# Patient Record
Sex: Female | Born: 1950 | Race: White | Hispanic: No | Marital: Married | Smoking: Current every day smoker
Health system: Southern US, Community
[De-identification: ages and names within clinical notes are randomized; demographics above are authoritative.]

## PROBLEM LIST (undated history)

## (undated) DIAGNOSIS — L409 Psoriasis, unspecified: Secondary | ICD-10-CM

## (undated) DIAGNOSIS — N3010 Interstitial cystitis (chronic) without hematuria: Secondary | ICD-10-CM

## (undated) DIAGNOSIS — M1990 Unspecified osteoarthritis, unspecified site: Secondary | ICD-10-CM

## (undated) DIAGNOSIS — T7840XA Allergy, unspecified, initial encounter: Secondary | ICD-10-CM

## (undated) HISTORY — DX: Interstitial cystitis (chronic) without hematuria: N30.10

## (undated) HISTORY — DX: Allergy, unspecified, initial encounter: T78.40XA

## (undated) HISTORY — PX: CERVICAL FUSION: SHX112

## (undated) HISTORY — DX: Psoriasis, unspecified: L40.9

## (undated) HISTORY — PX: CERVICAL LAMINECTOMY: SHX94

## (undated) HISTORY — DX: Unspecified osteoarthritis, unspecified site: M19.90

---

## 1998-07-04 ENCOUNTER — Ambulatory Visit (HOSPITAL_COMMUNITY): Admission: RE | Admit: 1998-07-04 | Discharge: 1998-07-04 | Payer: Self-pay | Admitting: Family Medicine

## 1998-07-04 ENCOUNTER — Encounter: Payer: Self-pay | Admitting: Family Medicine

## 2000-02-06 ENCOUNTER — Other Ambulatory Visit: Admission: RE | Admit: 2000-02-06 | Discharge: 2000-02-06 | Payer: Self-pay | Admitting: Obstetrics & Gynecology

## 2000-02-06 ENCOUNTER — Encounter (INDEPENDENT_AMBULATORY_CARE_PROVIDER_SITE_OTHER): Payer: Self-pay

## 2000-04-11 ENCOUNTER — Ambulatory Visit (HOSPITAL_COMMUNITY): Admission: RE | Admit: 2000-04-11 | Discharge: 2000-04-11 | Payer: Self-pay | Admitting: Family Medicine

## 2000-04-11 ENCOUNTER — Encounter: Payer: Self-pay | Admitting: Family Medicine

## 2001-12-11 ENCOUNTER — Other Ambulatory Visit: Admission: RE | Admit: 2001-12-11 | Discharge: 2001-12-11 | Payer: Self-pay | Admitting: Obstetrics & Gynecology

## 2003-03-18 ENCOUNTER — Other Ambulatory Visit: Admission: RE | Admit: 2003-03-18 | Discharge: 2003-03-18 | Payer: Self-pay | Admitting: Obstetrics & Gynecology

## 2004-04-17 ENCOUNTER — Other Ambulatory Visit: Admission: RE | Admit: 2004-04-17 | Discharge: 2004-04-17 | Payer: Self-pay | Admitting: Obstetrics & Gynecology

## 2004-09-03 ENCOUNTER — Ambulatory Visit (HOSPITAL_COMMUNITY): Admission: RE | Admit: 2004-09-03 | Discharge: 2004-09-03 | Payer: Self-pay | Admitting: Family Medicine

## 2004-09-04 ENCOUNTER — Ambulatory Visit (HOSPITAL_COMMUNITY): Admission: RE | Admit: 2004-09-04 | Discharge: 2004-09-04 | Payer: Self-pay | Admitting: Family Medicine

## 2005-06-12 ENCOUNTER — Other Ambulatory Visit: Admission: RE | Admit: 2005-06-12 | Discharge: 2005-06-12 | Payer: Self-pay | Admitting: Obstetrics & Gynecology

## 2005-10-09 ENCOUNTER — Emergency Department (HOSPITAL_COMMUNITY): Admission: EM | Admit: 2005-10-09 | Discharge: 2005-10-09 | Payer: Self-pay | Admitting: Emergency Medicine

## 2010-04-11 ENCOUNTER — Encounter: Admission: RE | Admit: 2010-04-11 | Discharge: 2010-04-11 | Payer: Self-pay

## 2011-05-13 ENCOUNTER — Ambulatory Visit (INDEPENDENT_AMBULATORY_CARE_PROVIDER_SITE_OTHER): Payer: BC Managed Care – PPO

## 2011-05-13 DIAGNOSIS — J019 Acute sinusitis, unspecified: Secondary | ICD-10-CM

## 2012-07-19 ENCOUNTER — Ambulatory Visit (INDEPENDENT_AMBULATORY_CARE_PROVIDER_SITE_OTHER): Payer: BC Managed Care – PPO | Admitting: Family Medicine

## 2012-07-19 VITALS — BP 137/82 | HR 83 | Temp 98.2°F | Resp 18 | Ht 61.5 in | Wt 152.8 lb

## 2012-07-19 DIAGNOSIS — J0100 Acute maxillary sinusitis, unspecified: Secondary | ICD-10-CM

## 2012-07-19 MED ORDER — AMOXICILLIN 500 MG PO CAPS: 1000.0000 mg | ORAL_CAPSULE | Freq: Three times a day (TID) | ORAL | Status: DC

## 2012-07-19 MED ORDER — PREDNISONE 20 MG PO TABS: ORAL_TABLET | ORAL | Status: DC

## 2012-07-19 MED ORDER — PROMETHAZINE-DM 6.25-15 MG/5ML PO SYRP: 5.0000 mL | ORAL_SOLUTION | Freq: Four times a day (QID) | ORAL | Status: DC | PRN

## 2012-07-19 NOTE — Progress Notes (Signed)
Subjective:    Patient ID: Tara Downs, female    DOB: 1951-03-12, 62 y.o.   MRN: [id number] Chief Complaint  Patient presents with  . Headache    congestion x 1 wk ? sinus infection    HPI  HA, sore throat, PND worse at night, little cough.  Has been going on for about a wk - gets right nasal congestion completely closed.   Past Medical History  Diagnosis Date  . Allergy   . Arthritis   . Interstitial cystitis   . Psoriasis    No current outpatient prescriptions on file prior to visit.   No current facility-administered medications on file prior to visit.   Allergies  Allergen Reactions  . Codeine Nausea And Vomiting  . Tetracyclines & Related     Dematologist told pt to never take tetracycline     Review of Systems  Constitutional: Positive for activity change, appetite change and fatigue. Negative for fever, chills and diaphoresis.  HENT: Positive for ear pain, congestion, sore throat, rhinorrhea, sneezing, postnasal drip and sinus pressure. Negative for nosebleeds, trouble swallowing, neck pain, neck stiffness, voice change and ear discharge.   Eyes: Negative for discharge and itching.  Respiratory: Positive for cough. Negative for shortness of breath.   Cardiovascular: Negative for chest pain.  Gastrointestinal: Negative for nausea, vomiting and abdominal pain.  Skin: Negative for rash.  Neurological: Positive for headaches. Negative for dizziness and syncope.  Hematological: Positive for adenopathy.  Psychiatric/Behavioral: Positive for sleep disturbance.      BP 137/82  Pulse 83  Temp(Src) 98.2 F (36.8 C) (Oral)  Resp 18  Ht 5' 1.5" (1.562 m)  Wt 152 lb 12.8 oz (69.31 kg)  BMI 28.41 kg/m2  SpO2 98% Objective:   Physical Exam  Constitutional: She is oriented to person, place, and time. She appears well-developed and well-nourished. She appears lethargic. She appears ill. No distress.  HENT:  Head: Normocephalic and atraumatic.  Right Ear: External  ear and ear canal normal. Tympanic membrane is injected and retracted. A middle ear effusion is present.  Left Ear: External ear and ear canal normal. Tympanic membrane is retracted. A middle ear effusion is present.  Nose: Mucosal edema, rhinorrhea and septal deviation present. Right sinus exhibits maxillary sinus tenderness and frontal sinus tenderness. Left sinus exhibits frontal sinus tenderness. Left sinus exhibits no maxillary sinus tenderness.  Mouth/Throat: Uvula is midline and mucous membranes are normal. Posterior oropharyngeal erythema present. No oropharyngeal exudate, posterior oropharyngeal edema or tonsillar abscesses.  Eyes: Conjunctivae are normal. Right eye exhibits no discharge. Left eye exhibits no discharge. No scleral icterus.  Neck: Normal range of motion. Neck supple.  Cardiovascular: Normal rate, regular rhythm, normal heart sounds and intact distal pulses.   Pulmonary/Chest: Effort normal and breath sounds normal.  Lymphadenopathy:       Head (right side): Submandibular adenopathy present. No preauricular and no posterior auricular adenopathy present.       Head (left side): Submandibular adenopathy present. No preauricular and no posterior auricular adenopathy present.    She has no cervical adenopathy.       Right: No supraclavicular adenopathy present.       Left: No supraclavicular adenopathy present.  Neurological: She is oriented to person, place, and time. She appears lethargic.  Skin: Skin is warm and dry. She is not diaphoretic. No erythema.  Psychiatric: She has a normal mood and affect. Her behavior is normal.     No results found for this or any  previous visit.  Assessment & Plan:  Sinusitis, acute maxillary  Meds ordered this encounter  Medications                                            . amoxicillin (AMOXIL) 500 MG capsule    Sig: Take 2 capsules (1,000 mg total) by mouth 3 (three) times daily.    Dispense:  60 capsule    Refill:  0   . promethazine-dextromethorphan (PROMETHAZINE-DM) 6.25-15 MG/5ML syrup    Sig: Take 5 mLs by mouth 4 (four) times daily as needed for cough.    Dispense:  120 mL    Refill:  0  . predniSONE (DELTASONE) 20 MG tablet    Sig: 3 tabs a day x 2d, 2 tabs a day x 2d, 1 tab a day x 2d    Dispense:  12 tablet    Refill:  0

## 2012-07-19 NOTE — Patient Instructions (Addendum)

## 2013-05-17 ENCOUNTER — Ambulatory Visit (INDEPENDENT_AMBULATORY_CARE_PROVIDER_SITE_OTHER): Payer: BC Managed Care – PPO | Admitting: Family Medicine

## 2013-05-17 VITALS — BP 126/78 | HR 73 | Temp 99.0°F | Resp 17 | Ht 62.0 in | Wt 149.0 lb

## 2013-05-17 DIAGNOSIS — E785 Hyperlipidemia, unspecified: Secondary | ICD-10-CM | POA: Insufficient documentation

## 2013-05-17 DIAGNOSIS — J329 Chronic sinusitis, unspecified: Secondary | ICD-10-CM

## 2013-05-17 DIAGNOSIS — G43909 Migraine, unspecified, not intractable, without status migrainosus: Secondary | ICD-10-CM

## 2013-05-17 MED ORDER — PREDNISONE 20 MG PO TABS: 40.0000 mg | ORAL_TABLET | Freq: Every day | ORAL | Status: DC

## 2013-05-17 MED ORDER — AMOXICILLIN 875 MG PO TABS: 875.0000 mg | ORAL_TABLET | Freq: Two times a day (BID) | ORAL | Status: DC

## 2013-05-17 NOTE — Progress Notes (Signed)
Patient ID: Tara Downs MRN: [id number], DOB: 10/09/1950, 62 y.o. Date of Encounter: 05/17/2013, 4:51 PM  Primary Physician: No primary provider on file.  Chief Complaint:  Chief Complaint  Patient presents with  . Sinusitis  . Nasal Congestion  . Headache    HPI: 62 y.o. year old female presents with 7 day history of nasal congestion, post nasal drip, sore throat, sinus pressure, and cough. Afebrile. No chills. Nasal congestion thick and green/yellow. Sinus pressure is the worst symptom. Cough is productive secondary to post nasal drip and not associated with time of day. Ears feel full, leading to sensation of muffled hearing. Has tried OTC cold preps without success. No GI complaints.   No recent antibiotics, recent travels, or sick contacts   No leg trauma, sedentary periods, h/o cancer, or tobacco use.  Past Medical History  Diagnosis Date  . Allergy   . Arthritis   . Interstitial cystitis   . Psoriasis      Home Meds: Prior to Admission medications   Medication Sig Start Date End Date Taking? Authorizing Provider  acitretin (SORIATANE) 10 MG capsule Take 10 mg by mouth 2 (two) times daily.   Yes Historical Provider, MD  amitriptyline (ELAVIL) 10 MG tablet Take 10 mg by mouth at bedtime.   Yes Historical Provider, MD  aspirin 81 MG tablet Take 81 mg by mouth daily.   Yes Historical Provider, MD  metoprolol (LOPRESSOR) 100 MG tablet Take 100 mg by mouth 1 day or 1 dose.   Yes Historical Provider, MD  pentosan polysulfate (ELMIRON) 100 MG capsule Take 100 mg by mouth 3 (three) times daily before meals.   Yes Historical Provider, MD  rosuvastatin (CRESTOR) 10 MG tablet Take 10 mg by mouth daily.   Yes Historical Provider, MD    Allergies:  Allergies  Allergen Reactions  . Codeine Nausea And Vomiting  . Tetracyclines & Related     Dematologist told pt to never take tetracycline    History   Social History  . Marital Status: Single    Spouse Name: N/A   Number of Children: N/A  . Years of Education: N/A   Occupational History  . Not on file.   Social History Main Topics  . Smoking status: Current Every Day Smoker -- 0.50 packs/day for 20 years    Types: Cigarettes  . Smokeless tobacco: Never Used  . Alcohol Use: No  . Drug Use: No  . Sexual Activity: Yes   Other Topics Concern  . Not on file   Social History Narrative  . No narrative on file     Review of Systems: Constitutional: negative for chills, fever, night sweats or weight changes Cardiovascular: negative for chest pain or palpitations Respiratory: negative for hemoptysis, wheezing, or shortness of breath Abdominal: negative for abdominal pain, nausea, vomiting or diarrhea Dermatological: negative for rash Neurologic: negative for headache   Physical Exam: Blood pressure 126/78, pulse 73, temperature 99 F (37.2 C), temperature source Oral, resp. rate 17, height 5\' 2"  (1.575 m), weight 149 lb (67.586 kg), SpO2 97.00%., Body mass index is 27.25 kg/(m^2). General: Well developed, well nourished, in no acute distress. Head: Normocephalic, atraumatic, eyes without discharge, sclera non-icteric, nares are congested. Bilateral auditory canals clear, TM's are without perforation, pearly grey with reflective cone of light bilaterally. Serous effusion bilaterally behind TM's. Maxillary sinus TTP. Oral cavity moist, dentition normal. Posterior pharynx with post nasal drip and mild erythema. No peritonsillar abscess or tonsillar exudate. Neck:  Supple. No thyromegaly. Full ROM. No lymphadenopathy. Lungs: Clear bilaterally to auscultation without wheezes, rales, or rhonchi. Breathing is unlabored.  Heart: RRR with S1 S2. No murmurs, rubs, or gallops appreciated. Msk:  Strength and tone normal for age. Extremities: No clubbing or cyanosis. No edema. Neuro: Alert and oriented X 3. Moves all extremities spontaneously. CNII-XII grossly in tact. Psych:  Responds to questions  appropriately with a normal affect.    ASSESSMENT AND PLAN:  62 y.o. year old female with sinusitis -Sinusitis - Plan: amoxicillin (AMOXIL) 875 MG tablet, predniSONE (DELTASONE) 20 MG tablet  Hyperlipidemia  Migraine headache    -Tylenol/Motrin prn -Rest/fluids -RTC precautions -RTC 3-5 days if no improvement  Signed, Elvina Sidle, MD 05/17/2013 4:51 PM

## 2013-05-17 NOTE — Patient Instructions (Signed)

## 2013-08-04 ENCOUNTER — Emergency Department (HOSPITAL_COMMUNITY)
Admission: EM | Admit: 2013-08-04 | Discharge: 2013-08-04 | Disposition: A | Discharge status: Home / Self Care | Payer: BC Managed Care – PPO | Attending: Emergency Medicine | Admitting: Emergency Medicine

## 2013-08-04 ENCOUNTER — Encounter (HOSPITAL_COMMUNITY): Payer: Self-pay | Admitting: Emergency Medicine

## 2013-08-04 ENCOUNTER — Emergency Department (HOSPITAL_COMMUNITY): Payer: BC Managed Care – PPO

## 2013-08-04 DIAGNOSIS — Z791 Long term (current) use of non-steroidal anti-inflammatories (NSAID): Secondary | ICD-10-CM | POA: Insufficient documentation

## 2013-08-04 DIAGNOSIS — Z7982 Long term (current) use of aspirin: Secondary | ICD-10-CM | POA: Insufficient documentation

## 2013-08-04 DIAGNOSIS — Z79899 Other long term (current) drug therapy: Secondary | ICD-10-CM | POA: Insufficient documentation

## 2013-08-04 DIAGNOSIS — R3915 Urgency of urination: Secondary | ICD-10-CM | POA: Insufficient documentation

## 2013-08-04 DIAGNOSIS — Z87448 Personal history of other diseases of urinary system: Secondary | ICD-10-CM | POA: Insufficient documentation

## 2013-08-04 DIAGNOSIS — R197 Diarrhea, unspecified: Secondary | ICD-10-CM | POA: Insufficient documentation

## 2013-08-04 DIAGNOSIS — Z981 Arthrodesis status: Secondary | ICD-10-CM | POA: Insufficient documentation

## 2013-08-04 DIAGNOSIS — L408 Other psoriasis: Secondary | ICD-10-CM | POA: Insufficient documentation

## 2013-08-04 DIAGNOSIS — F17200 Nicotine dependence, unspecified, uncomplicated: Secondary | ICD-10-CM | POA: Insufficient documentation

## 2013-08-04 DIAGNOSIS — M129 Arthropathy, unspecified: Secondary | ICD-10-CM | POA: Insufficient documentation

## 2013-08-04 DIAGNOSIS — R112 Nausea with vomiting, unspecified: Secondary | ICD-10-CM | POA: Insufficient documentation

## 2013-08-04 DIAGNOSIS — M542 Cervicalgia: Secondary | ICD-10-CM | POA: Insufficient documentation

## 2013-08-04 DIAGNOSIS — N23 Unspecified renal colic: Secondary | ICD-10-CM | POA: Insufficient documentation

## 2013-08-04 LAB — URINALYSIS, ROUTINE W REFLEX MICROSCOPIC
BILIRUBIN URINE: NEGATIVE
GLUCOSE, UA: NEGATIVE mg/dL
Ketones, ur: 15 mg/dL — AB
Nitrite: NEGATIVE
PH: 6 (ref 5.0–8.0)
Protein, ur: 30 mg/dL — AB
SPECIFIC GRAVITY, URINE: 1.018 (ref 1.005–1.030)
Urobilinogen, UA: 0.2 mg/dL (ref 0.0–1.0)

## 2013-08-04 LAB — CBC WITH DIFFERENTIAL/PLATELET
Basophils Absolute: 0 10*3/uL (ref 0.0–0.1)
Basophils Relative: 0 % (ref 0–1)
Eosinophils Absolute: 0 10*3/uL (ref 0.0–0.7)
Eosinophils Relative: 0 % (ref 0–5)
HCT: 41.1 % (ref 36.0–46.0)
HEMOGLOBIN: 13.9 g/dL (ref 12.0–15.0)
LYMPHS ABS: 1.1 10*3/uL (ref 0.7–4.0)
Lymphocytes Relative: 8 % — ABNORMAL LOW (ref 12–46)
MCH: 30.4 pg (ref 26.0–34.0)
MCHC: 33.8 g/dL (ref 30.0–36.0)
MCV: 89.9 fL (ref 78.0–100.0)
Monocytes Absolute: 0.8 10*3/uL (ref 0.1–1.0)
Monocytes Relative: 6 % (ref 3–12)
NEUTROS ABS: 12.6 10*3/uL — AB (ref 1.7–7.7)
NEUTROS PCT: 87 % — AB (ref 43–77)
PLATELETS: 248 10*3/uL (ref 150–400)
RBC: 4.57 MIL/uL (ref 3.87–5.11)
RDW: 12.2 % (ref 11.5–15.5)
WBC: 14.6 10*3/uL — ABNORMAL HIGH (ref 4.0–10.5)

## 2013-08-04 LAB — URINE MICROSCOPIC-ADD ON

## 2013-08-04 LAB — COMPREHENSIVE METABOLIC PANEL
ALBUMIN: 4.5 g/dL (ref 3.5–5.2)
ALK PHOS: 78 U/L (ref 39–117)
ALT: 19 U/L (ref 0–35)
AST: 23 U/L (ref 0–37)
BILIRUBIN TOTAL: 0.3 mg/dL (ref 0.3–1.2)
BUN: 13 mg/dL (ref 6–23)
CHLORIDE: 99 meq/L (ref 96–112)
CO2: 25 mEq/L (ref 19–32)
Calcium: 9.3 mg/dL (ref 8.4–10.5)
Creatinine, Ser: 0.99 mg/dL (ref 0.50–1.10)
GFR calc Af Amer: 70 mL/min — ABNORMAL LOW (ref >90)
GFR calc non Af Amer: 61 mL/min — ABNORMAL LOW (ref >90)
Glucose, Bld: 130 mg/dL — ABNORMAL HIGH (ref 70–99)
POTASSIUM: 3.4 meq/L — AB (ref 3.7–5.3)
SODIUM: 139 meq/L (ref 137–147)
Total Protein: 6.9 g/dL (ref 6.0–8.3)

## 2013-08-04 LAB — LIPASE, BLOOD: Lipase: 17 U/L (ref 11–59)

## 2013-08-04 MED ORDER — IOHEXOL 300 MG/ML  SOLN
50.0000 mL | Freq: Once | INTRAMUSCULAR | Status: AC | PRN
  Administered 2013-08-04: 50 mL via ORAL

## 2013-08-04 MED ORDER — HYDROMORPHONE HCL 2 MG PO TABS: 2.0000 mg | ORAL_TABLET | ORAL | Status: DC | PRN

## 2013-08-04 MED ORDER — ONDANSETRON 4 MG PO TBDP: 4.0000 mg | ORAL_TABLET | Freq: Three times a day (TID) | ORAL | Status: DC | PRN

## 2013-08-04 MED ORDER — SODIUM CHLORIDE 0.9 % IV BOLUS (SEPSIS)
1000.0000 mL | Freq: Once | INTRAVENOUS | Status: AC
  Administered 2013-08-04: 1000 mL via INTRAVENOUS

## 2013-08-04 MED ORDER — KETOROLAC TROMETHAMINE 15 MG/ML IJ SOLN
30.0000 mg | Freq: Once | INTRAMUSCULAR | Status: AC
  Administered 2013-08-04: 30 mg via INTRAVENOUS
  Filled 2013-08-04: qty 2

## 2013-08-04 MED ORDER — HYDROMORPHONE HCL PF 1 MG/ML IJ SOLN
1.0000 mg | INTRAMUSCULAR | Status: DC | PRN
  Administered 2013-08-04: 1 mg via INTRAVENOUS

## 2013-08-04 MED ORDER — TAMSULOSIN HCL 0.4 MG PO CAPS
0.4000 mg | ORAL_CAPSULE | Freq: Once | ORAL | Status: AC
  Administered 2013-08-04: 0.4 mg via ORAL
  Filled 2013-08-04: qty 1

## 2013-08-04 MED ORDER — HYDROMORPHONE HCL PF 1 MG/ML IJ SOLN
1.0000 mg | INTRAMUSCULAR | Status: DC | PRN
  Administered 2013-08-04: 1 mg via INTRAVENOUS
  Filled 2013-08-04 (×2): qty 1

## 2013-08-04 MED ORDER — IOHEXOL 300 MG/ML  SOLN
100.0000 mL | Freq: Once | INTRAMUSCULAR | Status: AC | PRN
  Administered 2013-08-04: 100 mL via INTRAVENOUS

## 2013-08-04 MED ORDER — ONDANSETRON HCL 4 MG/2ML IJ SOLN
4.0000 mg | Freq: Once | INTRAMUSCULAR | Status: AC
  Administered 2013-08-04: 4 mg via INTRAVENOUS
  Filled 2013-08-04: qty 2

## 2013-08-04 MED ORDER — IBUPROFEN 800 MG PO TABS: 800.0000 mg | ORAL_TABLET | Freq: Three times a day (TID) | ORAL | Status: DC

## 2013-08-04 MED ORDER — TAMSULOSIN HCL 0.4 MG PO CAPS: 0.4000 mg | ORAL_CAPSULE | Freq: Every day | ORAL | Status: DC

## 2013-08-04 NOTE — ED Provider Notes (Signed)
CSN: [id number]     Arrival date & time 08/04/13  0730 History   First MD Initiated Contact with Patient 08/04/13 [phone]     Chief Complaint  Patient presents with  . Abdominal Pain     HPI  Patient presents with abdominal pain. Nonlocalizing diffuse abdominal pain. He is on some nausea some diarrhea. She takes Pepto-Bismol. Felt better on Monday. Yesterday was Tuesday. Also nausea. Had pain in her abdomen is diffuse and nonlocalizing. Not intermittent but constant. She began vomiting again this morning he presents here. Pain is not localized she either the back of her anterior abdomen. No left or right side localization. No pain with movement. No fevers or chills. History of interstitial cystitis. Follows with [facility]. States this feels nothing like her symptoms with that. For dysuria frequency or hematuria.  Past Medical History  Diagnosis Date  . Allergy   . Arthritis   . Interstitial cystitis   . Psoriasis    Past Surgical History  Procedure Laterality Date  . Cervical laminectomy    . Cervical fusion     History reviewed. No pertinent family history. History  Substance Use Topics  . Smoking status: Current Every Day Smoker -- 0.50 packs/day for 20 years    Types: Cigarettes  . Smokeless tobacco: Never Used  . Alcohol Use: No   OB History   Grav Para Term Preterm Abortions TAB SAB Ect Mult Living                 Review of Systems  Constitutional: Negative for fever, chills, diaphoresis, appetite change and fatigue.  HENT: Negative for mouth sores, sore throat and trouble swallowing.   Eyes: Negative for visual disturbance.  Respiratory: Negative for cough, chest tightness, shortness of breath and wheezing.   Cardiovascular: Negative for chest pain.  Gastrointestinal: Positive for nausea, vomiting, abdominal pain and diarrhea. Negative for abdominal distention.  Endocrine: Negative for polydipsia, polyphagia and polyuria.  Genitourinary: Positive for urgency.  Negative for dysuria, frequency and hematuria.  Musculoskeletal: Positive for neck pain. Negative for gait problem and neck stiffness.  Skin: Negative for color change, pallor and rash.  Neurological: Negative for dizziness, syncope, light-headedness and headaches.  Hematological: Does not bruise/bleed easily.  Psychiatric/Behavioral: Negative for behavioral problems and confusion.      Allergies  Codeine and Tetracyclines & related  Home Medications   Current Outpatient Rx  Name  Route  Sig  Dispense  Refill  . acitretin (SORIATANE) 10 MG capsule   Oral   Take 20 mg by mouth daily.          Marland Kitchen amitriptyline (ELAVIL) 10 MG tablet   Oral   Take 10 mg by mouth at bedtime.         Marland Kitchen aspirin 81 MG tablet   Oral   Take 81 mg by mouth daily.         Marland Kitchen atorvastatin (LIPITOR) 20 MG tablet   Oral   Take 20 mg by mouth daily.         Marland Kitchen bismuth subsalicylate (PEPTO BISMOL) 262 MG/15ML suspension   Oral   Take 15 mLs by mouth every 2 (two) hours as needed for indigestion or diarrhea or loose stools.         . metoprolol (LOPRESSOR) 100 MG tablet   Oral   Take 100 mg by mouth daily.          . pentosan polysulfate (ELMIRON) 100 MG capsule   Oral  Take 100 mg by mouth 2 (two) times daily before a meal.          . traMADol (ULTRAM) 50 MG tablet   Oral   Take 50 mg by mouth every 6 (six) hours as needed for moderate pain.         Marland Kitchen tretinoin (RETIN-A) 0.05 % cream   Topical   Apply 1 application topically daily as needed.         Marland Kitchen HYDROmorphone (DILAUDID) 2 MG tablet   Oral   Take 1 tablet (2 mg total) by mouth every 4 (four) hours as needed for moderate pain.   20 tablet   0   . ibuprofen (ADVIL,MOTRIN) 800 MG tablet   Oral   Take 1 tablet (800 mg total) by mouth 3 (three) times daily.   21 tablet   0   . ondansetron (ZOFRAN ODT) 4 MG disintegrating tablet   Oral   Take 1 tablet (4 mg total) by mouth every 8 (eight) hours as needed for nausea.    20 tablet   0   . tamsulosin (FLOMAX) 0.4 MG CAPS capsule   Oral   Take 1 capsule (0.4 mg total) by mouth daily.   7 capsule   0    BP 132/57  Pulse 88  Temp(Src) 98.3 F (36.8 C) (Oral)  Resp 18  Wt 145 lb (65.772 kg)  SpO2 98% Physical Exam  Constitutional: She is oriented to person, place, and time. She appears well-developed and well-nourished. No distress.  Patient couple. Not writhing  HENT:  Head: Normocephalic.    Eyes: Conjunctivae are normal. Pupils are equal, round, and reactive to light. No scleral icterus.  Neck: Normal range of motion. Neck supple. No thyromegaly present.    Supple.  No meningismus  Cardiovascular: Normal rate and regular rhythm.  Exam reveals no gallop and no friction rub.   No murmur heard. Pulmonary/Chest: Effort normal and breath sounds normal. No respiratory distress. She has no wheezes. She has no rales.  Abdominal: Soft. Bowel sounds are normal. She exhibits no distension. There is no tenderness. There is no rebound.  Complaints of diffuse pain. No peritoneal irritation. No reproducible pain in the left , a right lower abdomen.  Musculoskeletal: Normal range of motion.  Neurological: She is alert and oriented to person, place, and time.     Skin: Skin is warm and dry. No rash noted.  Psychiatric: She has a normal mood and affect. Her behavior is normal.    ED Course  Procedures (including critical care time) Labs Review Labs Reviewed  CBC WITH DIFFERENTIAL - Abnormal; Notable for the following:    WBC 14.6 (*)    Neutrophils Relative % 87 (*)    Neutro Abs 12.6 (*)    Lymphocytes Relative 8 (*)    All other components within normal limits  COMPREHENSIVE METABOLIC PANEL - Abnormal; Notable for the following:    Potassium 3.4 (*)    Glucose, Bld 130 (*)    GFR calc non Af Amer 61 (*)    GFR calc Af Amer 70 (*)    All other components within normal limits  URINALYSIS, ROUTINE W REFLEX MICROSCOPIC - Abnormal; Notable for the  following:    APPearance CLOUDY (*)    Hgb urine dipstick LARGE (*)    Ketones, ur 15 (*)    Protein, ur 30 (*)    Leukocytes, UA SMALL (*)    All other components within normal limits  URINE MICROSCOPIC-ADD ON - Abnormal; Notable for the following:    Bacteria, UA FEW (*)    Crystals CA OXALATE CRYSTALS (*)    All other components within normal limits  LIPASE, BLOOD   Imaging Review Ct Abdomen Pelvis W Contrast  08/04/2013   CLINICAL DATA Abdominal pain  EXAM CT ABDOMEN AND PELVIS WITH CONTRAST  TECHNIQUE Multidetector CT imaging of the abdomen and pelvis was performed using the standard protocol following bolus administration of intravenous contrast.  CONTRAST 57mL OMNIPAQUE IOHEXOL 300 MG/ML SOLN, 150mL OMNIPAQUE IOHEXOL 300 MG/ML SOLN  COMPARISON CT CHEST W/CM dated 09/04/2004  FINDINGS There is a 2 mm left lower lobe subpleural pulmonary nodule unchanged compared with 09/05/2006. There is a calcified 3 mm left lower lobe subpleural pulmonary nodule likely representing sequela prior granulomatous disease unchanged from the prior exam.  The liver demonstrates no focal abnormality. There is no intrahepatic or extrahepatic biliary ductal dilatation. The gallbladder is normal. The spleen demonstrates no focal abnormality. There is a 6 mm distal right ureteral calculus just proximal to the UVJ resulting in moderate right hydroureteronephrosis and right perinephric stranding. Are punctate nonobstructing bilateral renal calculi. The adrenal glands and pancreas are normal. The bladder is unremarkable.  The stomach, duodenum, small intestine, and large intestine demonstrate no contrast extravasation or dilatation. There is no pneumoperitoneum, pneumatosis, or portal venous gas. There is no abdominal or pelvic free fluid. There is no lymphadenopathy. There are bilateral pleural lymph nodes measuring 7 mm in short axis on the right and 6.8 mm in short axis on the left unchanged in the prior exam.  The  abdominal aorta is normal in caliber with atherosclerosis.  There are no lytic or sclerotic osseous lesions.  IMPRESSION There is a 6 mm distal right ureteral calculus just proximal to the UVJ resulting in moderate right hydroureteronephrosis and right perinephric stranding.  SIGNATURE  Electronically Signed   By: Kathreen Devoid   On: 08/04/2013 10:27     EKG Interpretation None      MDM   Final diagnoses:  Ureteral colic    PT DC'd after medication.  Urology f/u prn.    Tanna Furry, MD 08/05/13 1100

## 2013-08-04 NOTE — Discharge Instructions (Signed)
Kidney Stones  Kidney stones (urolithiasis) are deposits that form inside your kidneys. The intense pain is caused by the stone moving through the urinary tract. When the stone moves, the ureter goes into spasm around the stone. The stone is usually passed in the urine.   CAUSES   · A disorder that makes certain neck glands produce too much parathyroid hormone (primary hyperparathyroidism).  · A buildup of uric acid crystals, similar to gout in your joints.  · Narrowing (stricture) of the ureter.  · A kidney obstruction present at birth (congenital obstruction).  · Previous surgery on the kidney or ureters.  · Numerous kidney infections.  SYMPTOMS   · Feeling sick to your stomach (nauseous).  · Throwing up (vomiting).  · Blood in the urine (hematuria).  · Pain that usually spreads (radiates) to the groin.  · Frequency or urgency of urination.  DIAGNOSIS   · Taking a history and physical exam.  · Blood or urine tests.  · CT scan.  · Occasionally, an examination of the inside of the urinary bladder (cystoscopy) is performed.  TREATMENT   · Observation.  · Increasing your fluid intake.  · Extracorporeal shock wave lithotripsy This is a noninvasive procedure that uses shock waves to break up kidney stones.  · Surgery may be needed if you have severe pain or persistent obstruction. There are various surgical procedures. Most of the procedures are performed with the use of small instruments. Only small incisions are needed to accommodate these instruments, so recovery time is minimized.  The size, location, and chemical composition are all important variables that will determine the proper choice of action for you. Talk to your health care provider to better understand your situation so that you will minimize the risk of injury to yourself and your kidney.   HOME CARE INSTRUCTIONS   · Drink enough water and fluids to keep your urine clear or pale yellow. This will help you to pass the stone or stone fragments.  · Strain  all urine through the provided strainer. Keep all particulate matter and stones for your health care provider to see. The stone causing the pain may be as small as a grain of salt. It is very important to use the strainer each and every time you pass your urine. The collection of your stone will allow your health care provider to analyze it and verify that a stone has actually passed. The stone analysis will often identify what you can do to reduce the incidence of recurrences.  · Only take over-the-counter or prescription medicines for pain, discomfort, or fever as directed by your health care provider.  · Make a follow-up appointment with your health care provider as directed.  · Get follow-up X-rays if required. The absence of pain does not always mean that the stone has passed. It may have only stopped moving. If the urine remains completely obstructed, it can cause loss of kidney function or even complete destruction of the kidney. It is your responsibility to make sure X-rays and follow-ups are completed. Ultrasounds of the kidney can show blockages and the status of the kidney. Ultrasounds are not associated with any radiation and can be performed easily in a matter of minutes.  SEEK MEDICAL CARE IF:  · You experience pain that is progressive and unresponsive to any pain medicine you have been prescribed.  SEEK IMMEDIATE MEDICAL CARE IF:   · Pain cannot be controlled with the prescribed medicine.  · You have a fever   or shaking chills.  · The severity or intensity of pain increases over 18 hours and is not relieved by pain medicine.  · You develop a new onset of abdominal pain.  · You feel faint or pass out.  · You are unable to urinate.  MAKE SURE YOU:   · Understand these instructions.  · Will watch your condition.  · Will get help right away if you are not doing well or get worse.  Document Released: 05/13/2005 Document Revised: 01/13/2013 Document Reviewed: 10/14/2012  [employer]® Patient Information ©2014  [employer].

## 2013-08-04 NOTE — ED Notes (Signed)
Pt alert, arrives from home, c/o gen abd pain, onset was Monday, states diarrhea and pain, went to work yesterday, pain returns last PM, + nausea, + urgency,  resp even unlabored, skin pwd

## 2013-08-13 ENCOUNTER — Ambulatory Visit (INDEPENDENT_AMBULATORY_CARE_PROVIDER_SITE_OTHER): Payer: BC Managed Care – PPO | Admitting: Emergency Medicine

## 2013-08-13 VITALS — BP 136/60 | HR 90 | Temp 98.4°F | Resp 18 | Ht 61.5 in | Wt 141.8 lb

## 2013-08-13 DIAGNOSIS — J329 Chronic sinusitis, unspecified: Secondary | ICD-10-CM

## 2013-08-13 MED ORDER — FLUTICASONE PROPIONATE 50 MCG/ACT NA SUSP: 2.0000 | Freq: Every day | NASAL | Status: DC

## 2013-08-13 MED ORDER — AMOXICILLIN 875 MG PO TABS: 875.0000 mg | ORAL_TABLET | Freq: Two times a day (BID) | ORAL | Status: DC

## 2013-08-13 NOTE — Patient Instructions (Signed)

## 2013-08-13 NOTE — Progress Notes (Signed)
   Subjective:    Patient ID: Tara Downs, female    DOB: Jan 24, 1951, 63 y.o.   MRN: [id number]  HPI  63 y.o. Female presents to clinic with sinus symptoms, congestions and headache. Reports that she is currently under the care of [facility] for a kidney stone. States that it's a 6.  Reports that drainage is yellow in color. States that she has a productive cough that just started. Reports that symptoms started with sore throat and moved down from there.    Review of Systems     Objective:   Physical Exam HEENT exam reveals TMs to be clear nose is very dry with crusting throat is normal chest is clear        Assessment & Plan:  Patient currently in the process of passing a kidney stone. She will be treated with amoxicillin and Flonase. For a sinus infection.

## 2014-01-26 ENCOUNTER — Other Ambulatory Visit: Payer: Self-pay | Admitting: Obstetrics & Gynecology

## 2014-01-26 DIAGNOSIS — R928 Other abnormal and inconclusive findings on diagnostic imaging of breast: Secondary | ICD-10-CM

## 2014-02-04 ENCOUNTER — Ambulatory Visit
Admission: RE | Admit: 2014-02-04 | Discharge: 2014-02-04 | Disposition: A | Discharge status: Home / Self Care | Payer: BC Managed Care – PPO | Source: Ambulatory Visit | Attending: Obstetrics & Gynecology | Admitting: Obstetrics & Gynecology

## 2014-02-04 DIAGNOSIS — R928 Other abnormal and inconclusive findings on diagnostic imaging of breast: Secondary | ICD-10-CM

## 2014-02-07 ENCOUNTER — Other Ambulatory Visit: Payer: BC Managed Care – PPO

## 2014-04-09 ENCOUNTER — Ambulatory Visit (INDEPENDENT_AMBULATORY_CARE_PROVIDER_SITE_OTHER): Payer: BC Managed Care – PPO | Admitting: Family Medicine

## 2014-04-09 VITALS — BP 131/77 | HR 62 | Temp 98.1°F | Resp 16 | Ht 62.0 in | Wt 150.0 lb

## 2014-04-09 DIAGNOSIS — R300 Dysuria: Secondary | ICD-10-CM

## 2014-04-09 LAB — POCT URINALYSIS DIPSTICK
Bilirubin, UA: NEGATIVE
Glucose, UA: NEGATIVE
Ketones, UA: NEGATIVE
Nitrite, UA: NEGATIVE
Protein, UA: NEGATIVE
Spec Grav, UA: 1.01
Urobilinogen, UA: 0.2
pH, UA: 6

## 2014-04-09 LAB — POCT UA - MICROSCOPIC ONLY
Casts, Ur, LPF, POC: NEGATIVE
Crystals, Ur, HPF, POC: NEGATIVE
Mucus, UA: NEGATIVE
RBC, urine, microscopic: NEGATIVE
Yeast, UA: NEGATIVE

## 2014-04-09 MED ORDER — CIPROFLOXACIN HCL 250 MG PO TABS: 250.0000 mg | ORAL_TABLET | Freq: Two times a day (BID) | ORAL | Status: DC

## 2014-04-09 MED ORDER — PHENAZOPYRIDINE HCL 200 MG PO TABS: 200.0000 mg | ORAL_TABLET | Freq: Three times a day (TID) | ORAL | Status: DC | PRN

## 2014-04-09 NOTE — Progress Notes (Signed)
Subjective:    Patient ID: Tara Downs, female    DOB: 04/18/1951, 63 y.o.   MRN: [id number]  HPI Chief Complaint  Patient presents with  . Dysuria    x last night  . Back Pain    x last night   This chart was scribed for Robyn Haber, MD by Thea Alken, ED Scribe. This patient was seen in room 4 and the patient's care was started at 2:20 PM.  HPI Comments: Tara Downs is a 63 y.o. female who presents to the [facility] complaining of a possible UTI with associated dysuria and back pain that began last night. Pt has tried uribel without relief due to medication possibly being expired. She reports hx of interstitial cyst but denies similar symptoms. She denies fever, chills, and hematuria.   Past Medical History  Diagnosis Date  . Allergy   . Arthritis   . Interstitial cystitis   . Psoriasis    Allergies  Allergen Reactions  . Codeine Nausea And Vomiting  . Tetracyclines & Related Other (See Comments)    Dematologist told pt to never take tetracycline, drug interactions    Prior to Admission medications   Medication Sig Start Date End Date Taking? Authorizing Provider  acitretin (SORIATANE) 10 MG capsule Take 20 mg by mouth daily.    Yes Historical Provider, MD  amitriptyline (ELAVIL) 10 MG tablet Take 10 mg by mouth at bedtime.   Yes Historical Provider, MD  aspirin 81 MG tablet Take 81 mg by mouth daily.   Yes Historical Provider, MD  atorvastatin (LIPITOR) 20 MG tablet Take 20 mg by mouth daily.   Yes Historical Provider, MD  ibuprofen (ADVIL,MOTRIN) 800 MG tablet Take 1 tablet (800 mg total) by mouth 3 (three) times daily. 08/04/13  Yes Tanna Furry, MD  metoprolol (LOPRESSOR) 100 MG tablet Take 100 mg by mouth daily.    Yes Historical Provider, MD  pentosan polysulfate (ELMIRON) 100 MG capsule Take 100 mg by mouth 2 (two) times daily before a meal.    Yes Historical Provider, MD  traMADol (ULTRAM) 50 MG tablet Take 50 mg by mouth every 6 (six)  hours as needed for moderate pain.   Yes Historical Provider, MD  tretinoin (RETIN-A) 0.05 % cream Apply 1 application topically daily as needed.   Yes Historical Provider, MD  amoxicillin (AMOXIL) 875 MG tablet Take 1 tablet (875 mg total) by mouth 2 (two) times daily. 08/13/13   Darlyne Russian, MD  fluticasone (FLONASE) 50 MCG/ACT nasal spray Place 2 sprays into both nostrils daily. 08/13/13   Darlyne Russian, MD  HYDROmorphone (DILAUDID) 2 MG tablet Take 1 tablet (2 mg total) by mouth every 4 (four) hours as needed for moderate pain. 08/04/13   Tanna Furry, MD  oxybutynin (DITROPAN) 5 MG tablet Take 5 mg by mouth 3 (three) times daily.    Historical Provider, MD  phenazopyridine (PYRIDIUM) 200 MG tablet Take 200 mg by mouth 3 (three) times daily as needed for pain.    Historical Provider, MD  tamsulosin (FLOMAX) 0.4 MG CAPS capsule Take 1 capsule (0.4 mg total) by mouth daily. 08/04/13   Tanna Furry, MD   Review of Systems  Constitutional: Negative for fever and chills.  Genitourinary: Positive for dysuria. Negative for hematuria.  Musculoskeletal: Positive for back pain.    Objective:   Physical Exam  Constitutional: She is oriented to person, place, and time. She appears well-developed and well-nourished. No distress.  HENT:  Head: Normocephalic and atraumatic.  Eyes: Conjunctivae and EOM are normal.  Neck: Neck supple.  Cardiovascular: Normal rate.   Pulmonary/Chest: Effort normal.  Musculoskeletal: Normal range of motion.  Neurological: She is alert and oriented to person, place, and time.  Skin: Skin is warm and dry.  Psychiatric: She has a normal mood and affect. Her behavior is normal.  Nursing note and vitals reviewed.  Filed Vitals:   04/09/14 1412  BP: 131/77  Pulse: 62  Temp: 98.1 F (36.7 C)  Resp: 16    Assessment & Plan:   1. Dysuria    Meds ordered this encounter  Medications  . phenazopyridine (PYRIDIUM) 200 MG tablet    Sig: Take 1 tablet (200 mg total) by  mouth 3 (three) times daily as needed for pain.    Dispense:  10 tablet    Refill:  3   Dysuria - Plan: POCT urinalysis dipstick, POCT UA - Microscopic Only, phenazopyridine (PYRIDIUM) 200 MG tablet, Urine culture, ciprofloxacin (CIPRO) 250 MG tablet  Signed, Robyn Haber, MD

## 2014-04-09 NOTE — Patient Instructions (Signed)

## 2014-04-11 LAB — URINE CULTURE: Colony Count: >=100000

## 2014-07-01 ENCOUNTER — Other Ambulatory Visit: Payer: Self-pay | Admitting: Obstetrics & Gynecology

## 2014-07-01 DIAGNOSIS — N63 Unspecified lump in unspecified breast: Secondary | ICD-10-CM

## 2014-07-01 DIAGNOSIS — N6489 Other specified disorders of breast: Secondary | ICD-10-CM

## 2014-08-05 ENCOUNTER — Ambulatory Visit
Admission: RE | Admit: 2014-08-05 | Discharge: 2014-08-05 | Disposition: A | Discharge status: Home / Self Care | Payer: BC Managed Care – PPO | Source: Ambulatory Visit | Attending: Obstetrics & Gynecology | Admitting: Obstetrics & Gynecology

## 2014-08-05 ENCOUNTER — Other Ambulatory Visit: Payer: Self-pay | Admitting: Obstetrics and Gynecology

## 2014-08-05 DIAGNOSIS — N6489 Other specified disorders of breast: Secondary | ICD-10-CM

## 2014-08-05 DIAGNOSIS — N63 Unspecified lump in unspecified breast: Secondary | ICD-10-CM

## 2014-09-01 ENCOUNTER — Ambulatory Visit (INDEPENDENT_AMBULATORY_CARE_PROVIDER_SITE_OTHER): Payer: BC Managed Care – PPO | Admitting: Family Medicine

## 2014-09-01 VITALS — BP 122/70 | HR 72 | Temp 98.1°F | Resp 20 | Ht 61.5 in | Wt 153.1 lb

## 2014-09-01 DIAGNOSIS — J0100 Acute maxillary sinusitis, unspecified: Secondary | ICD-10-CM | POA: Diagnosis not present

## 2014-09-01 DIAGNOSIS — H9201 Otalgia, right ear: Secondary | ICD-10-CM | POA: Diagnosis not present

## 2014-09-01 DIAGNOSIS — J301 Allergic rhinitis due to pollen: Secondary | ICD-10-CM

## 2014-09-01 MED ORDER — LORATADINE 10 MG PO TABS
10.0000 mg | ORAL_TABLET | Freq: Every day | ORAL | Status: DC
Start: 2014-09-01 — End: 2015-12-14

## 2014-09-01 MED ORDER — FLUTICASONE PROPIONATE 50 MCG/ACT NA SUSP: 2.0000 | Freq: Every day | NASAL | Status: DC

## 2014-09-01 MED ORDER — AMOXICILLIN 500 MG PO TABS
1000.0000 mg | ORAL_TABLET | Freq: Two times a day (BID) | ORAL | Status: DC
Start: 2014-09-01 — End: 2015-03-02

## 2014-09-01 NOTE — Patient Instructions (Signed)

## 2014-09-01 NOTE — Progress Notes (Signed)
Subjective:    Patient ID: Tara Downs, female    DOB: Dec 01, 1950, 63 y.o.   MRN: [id number] This chart was scribed for Reginia Forts, MD by Zola Button, Medical Scribe. This patient was seen in Room 9 and the patient's care was started at 5:27 PM.   09/01/2014  Ear Pain   HPI  HPI Comments: Tara Downs is a 64 y.o. female who presents to the [facility] complaining of gradual onset right ear pain that started last night. Patient also reports having sinus congestion for the past 3 weeks, headache for the past 2-3 days, rhinorrhea, sore throat and cough. She thinks her sinus issues may be related to her ear pain. The sore throat is worse after laying down. The cough is mostly at night and the first 2-3 hours after waking up. Patient has been using sudafed and robitussin for her sinus congestion. She has not been using Flonase, Claritin, Allegra, or Zyrtec. She denies fever, chills, diaphoresis, sneezing, hearing loss and ear discharge. She also denies hx of frequent ear infections. Patient notes allergies to tetracycline and that Augmentin upsets her stomach, but she is able to tolerate amoxicillin. She notes that she normally has sinus issues about twice a year.   Review of Systems  Constitutional: Negative for fever, chills, diaphoresis and fatigue.  HENT: Positive for congestion, ear pain, rhinorrhea, sinus pressure and sore throat. Negative for ear discharge, hearing loss, sneezing, trouble swallowing and voice change.   Respiratory: Positive for cough. Negative for shortness of breath, wheezing and stridor.   Cardiovascular: Negative for chest pain, palpitations and leg swelling.  Skin: Negative for rash.  Neurological: Positive for headaches.    Past Medical History  Diagnosis Date  . Allergy   . Arthritis   . Interstitial cystitis   . Psoriasis    Past Surgical History  Procedure Laterality Date  . Cervical laminectomy    . Cervical fusion      Allergies  Allergen Reactions  . Codeine Nausea And Vomiting  . Tetracyclines & Related Other (See Comments)    Dematologist told pt to never take tetracycline, drug interactions         Objective:    BP 122/70 mmHg  Pulse 72  Temp(Src) 98.1 F (36.7 C) (Oral)  Resp 20  Ht 5' 1.5" (1.562 m)  Wt 153 lb 2 oz (69.457 kg)  BMI 28.47 kg/m2  SpO2 95% Physical Exam  Constitutional: She is oriented to person, place, and time. She appears well-developed and well-nourished. No distress.  HENT:  Head: Normocephalic and atraumatic.  Right Ear: External ear and ear canal normal. Tympanic membrane is erythematous.  Left Ear: External ear and ear canal normal. Tympanic membrane is bulging.  Nose: Right sinus exhibits maxillary sinus tenderness and frontal sinus tenderness. Left sinus exhibits maxillary sinus tenderness and frontal sinus tenderness.  Mouth/Throat: Oropharynx is clear and moist. No oropharyngeal exudate.  Slight erythema to inferior aspect of right TM; fluid behind eardrum. Frontal and maxillary sinus pressure.  Eyes: Pupils are equal, round, and reactive to light.  Neck: Neck supple.  Cardiovascular: Normal rate, regular rhythm and normal heart sounds.   No murmur heard. Pulmonary/Chest: Effort normal and breath sounds normal. No respiratory distress. She has no wheezes. She has no rales.  CTAB.  Musculoskeletal: She exhibits no edema.  Lymphadenopathy:    She has no cervical adenopathy.  Neurological: She is alert and oriented to person, place, and time. No cranial  nerve deficit.  Skin: Skin is warm and dry. No rash noted.  Psychiatric: She has a normal mood and affect. Her behavior is normal.  Vitals reviewed.       Assessment & Plan:   1. Acute maxillary sinusitis, recurrence not specified   2. Allergic rhinitis due to pollen   3. Otalgia of right ear     -New. -Rx for Amoxicillin to treat sinusitis and early otitis media. -Rx for Claritin 10mg  daily and  Flonase provided to use daily during Spring allergy season. -RTC for acute worsening.    Meds ordered this encounter  Medications  . fluticasone (FLONASE) 50 MCG/ACT nasal spray    Sig: Place 2 sprays into both nostrils daily.    Dispense:  16 g    Refill:  11  . loratadine (CLARITIN) 10 MG tablet    Sig: Take 1 tablet (10 mg total) by mouth daily.    Dispense:  30 tablet    Refill:  11  . amoxicillin (AMOXIL) 500 MG tablet    Sig: Take 2 tablets (1,000 mg total) by mouth 2 (two) times daily.    Dispense:  40 tablet    Refill:  0    No Follow-up on file.   I personally performed the services described in this documentation, which was scribed in my presence. The recorded information has been reviewed and considered.  Ichael Pullara Elayne Guerin, M.D. [facility] [address] [place], [place]  [phone] phone [phone] fax

## 2015-01-20 ENCOUNTER — Other Ambulatory Visit: Payer: Self-pay | Admitting: Obstetrics & Gynecology

## 2015-01-20 DIAGNOSIS — D241 Benign neoplasm of right breast: Secondary | ICD-10-CM

## 2015-03-02 ENCOUNTER — Ambulatory Visit (INDEPENDENT_AMBULATORY_CARE_PROVIDER_SITE_OTHER): Payer: BC Managed Care – PPO | Admitting: Family Medicine

## 2015-03-02 VITALS — BP 136/80 | HR 108 | Temp 98.5°F | Resp 18 | Ht 61.5 in | Wt 145.0 lb

## 2015-03-02 DIAGNOSIS — K529 Noninfective gastroenteritis and colitis, unspecified: Secondary | ICD-10-CM

## 2015-03-02 DIAGNOSIS — R6889 Other general symptoms and signs: Secondary | ICD-10-CM

## 2015-03-02 DIAGNOSIS — E860 Dehydration: Secondary | ICD-10-CM

## 2015-03-02 DIAGNOSIS — R197 Diarrhea, unspecified: Secondary | ICD-10-CM | POA: Diagnosis not present

## 2015-03-02 DIAGNOSIS — R110 Nausea: Secondary | ICD-10-CM

## 2015-03-02 LAB — POCT CBC
Granulocyte percent: 67.1 %G (ref 37–80)
HCT, POC: 48.3 % — AB (ref 37.7–47.9)
Hemoglobin: 14.9 g/dL (ref 12.2–16.2)
Lymph, poc: 1.7 (ref 0.6–3.4)
MCH, POC: 27.7 pg (ref 27–31.2)
MCHC: 30.9 g/dL — AB (ref 31.8–35.4)
MCV: 89.9 fL (ref 80–97)
MID (cbc): 0.5 (ref 0–0.9)
MPV: 8.5 fL (ref 0–99.8)
POC Granulocyte: 4.5 (ref 2–6.9)
POC LYMPH PERCENT: 25 % (ref 10–50)
POC MID %: 7.9 %M (ref 0–12)
Platelet Count, POC: 236 10*3/uL (ref 142–424)
RBC: 5.37 M/uL (ref 4.04–5.48)
RDW, POC: 14.3 %
WBC: 6.7 10*3/uL (ref 4.6–10.2)

## 2015-03-02 LAB — POCT INFLUENZA A/B
Influenza A, POC: NEGATIVE
Influenza B, POC: NEGATIVE

## 2015-03-02 LAB — COMPLETE METABOLIC PANEL WITHOUT GFR
ALT: 22 U/L (ref 6–29)
BUN: 9 mg/dL (ref 7–25)
CO2: 27 mmol/L (ref 20–31)
Calcium: 9.3 mg/dL (ref 8.6–10.4)
Creat: 0.89 mg/dL (ref 0.50–0.99)
GFR, Est African American: 81 mL/min (ref >=60)
Total Protein: 7.1 g/dL (ref 6.1–8.1)

## 2015-03-02 LAB — GLUCOSE, POCT (MANUAL RESULT ENTRY): POC Glucose: 86 mg/dL (ref 70–99)

## 2015-03-02 LAB — COMPLETE METABOLIC PANEL WITH GFR
AST: 26 U/L (ref 10–35)
Albumin: 4.4 g/dL (ref 3.6–5.1)
Alkaline Phosphatase: 73 U/L (ref 33–130)
Chloride: 103 mmol/L (ref 98–110)
GFR, Est Non African American: 70 mL/min (ref >=60)
Glucose, Bld: 88 mg/dL (ref 65–99)
Potassium: 3.8 mmol/L (ref 3.5–5.3)
Sodium: 140 mmol/L (ref 135–146)
Total Bilirubin: 0.4 mg/dL (ref 0.2–1.2)

## 2015-03-02 MED ORDER — ONDANSETRON 4 MG PO TBDP: 4.0000 mg | ORAL_TABLET | Freq: Three times a day (TID) | ORAL | Status: DC | PRN

## 2015-03-02 NOTE — Progress Notes (Signed)
Chief Complaint:  Chief Complaint  Patient presents with  . Diarrhea    Onset 3 days  . Emesis    Onset 2 days  . Chills    Onset 3 days    HPI: Tara Downs is a 64 y.o. female who reports to [facility] today complaining of 3 day hx of HA, fever, chills, nonbloody diarrhea, flu like sxs And vomiting. She has tried immodium. Looks normal but chocolate color, when she took pepto it turned black, it is less runny today.  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  No urinary sxs, no vaginal dc, no new rashes or joitnpain, nonbloody diarrhea, no odor, colonscopy ? No new foods, no new travels. She has finished the abx  Past Medical History  Diagnosis Date  . Allergy   . Arthritis   . Interstitial cystitis   . Psoriasis    Past Surgical History  Procedure Laterality Date  . Cervical laminectomy    . Cervical fusion     Social History   Social History  . Marital Status: Married    Spouse Name: N/A  . Number of Children: N/A  . Years of Education: N/A   Social History Main Topics  . Smoking status: Current Every Day Smoker -- 0.50 packs/day for 20 years    Types: Cigarettes  . Smokeless tobacco: Never Used  . Alcohol Use: No  . Drug Use: No  . Sexual Activity: Yes   Other Topics Concern  . None   Social History Narrative   No family history on file. Allergies  Allergen Reactions  . Codeine Nausea And Vomiting  . Tetracyclines & Related Other (See Comments)    Dematologist told pt to never take tetracycline, drug interactions    Prior to Admission medications   Medication Sig Start Date End Date Taking? Authorizing Provider  acitretin (SORIATANE) 10 MG capsule Take 20 mg by mouth daily.    Yes Historical Provider, MD  amitriptyline (ELAVIL) 10 MG tablet Take 10 mg by mouth at bedtime.   Yes Historical Provider, MD  aspirin 81 MG tablet Take 81 mg by mouth daily.   Yes Historical  Provider, MD  atorvastatin (LIPITOR) 20 MG tablet Take 20 mg by mouth daily.   Yes Historical Provider, MD  metoprolol (LOPRESSOR) 100 MG tablet Take 100 mg by mouth daily.    Yes Historical Provider, MD  pentosan polysulfate (ELMIRON) 100 MG capsule Take 100 mg by mouth 2 (two) times daily before a meal.    Yes Historical Provider, MD  traMADol (ULTRAM) 50 MG tablet Take 50 mg by mouth every 6 (six) hours as needed for moderate pain.   Yes Historical Provider, MD  tretinoin (RETIN-A) 0.05 % cream Apply 1 application topically daily as needed.   Yes Historical Provider, MD  loratadine (CLARITIN) 10 MG tablet Take 1 tablet (10 mg total) by mouth daily. Patient not taking: Reported on 03/02/2015 09/01/14   Wardell Honour, MD     ROS: The patient denies night sweats, unintentional weight loss, chest pain, palpitations, wheezing, dyspnea on exertion, abdominal pain, dysuria, hematuria, melena, numbness, , or tingling.   All other systems have been reviewed and were otherwise negative with the exception of those mentioned in the HPI and as above.    PHYSICAL EXAM: Filed Vitals:   03/02/15 1026  BP: 136/80  Pulse: 108  Temp:  98.5 F (36.9 C)  Resp: 18   Body mass index is 26.96 kg/(m^2).   General: Alert, mild acute distress HEENT:  Normocephalic, atraumatic, oropharynx patent. EOMI, PERRLA, tm nl,  Oral mucosa dry  Cardiovascular:  Sinus tach no rubs murmurs or gallops.  No Carotid bruits, radial pulse intact. No pedal edema.  Respiratory: Clear to auscultation bilaterally.  No wheezes, rales, or rhonchi.  No cyanosis, no use of accessory musculature Abdominal: No organomegaly, abdomen is soft and non-tender, positive bowel sounds. No masses. Skin: No rashes. Neurologic: Facial musculature symmetric. Psychiatric: Patient acts appropriately throughout our interaction. Lymphatic: No cervical or submandibular lymphadenopathy Musculoskeletal: Gait intact. No edema,  tenderness   LABS: Results for orders placed or performed in visit on 03/02/15  POCT CBC  Result Value Ref Range   WBC 6.7 4.6 - 10.2 K/uL   Lymph, poc 1.7 0.6 - 3.4   POC LYMPH PERCENT 25.0 10 - 50 %L   MID (cbc) 0.5 0 - 0.9   POC MID % 7.9 0 - 12 %M   POC Granulocyte 4.5 2 - 6.9   Granulocyte percent 67.1 37 - 80 %G   RBC 5.37 4.04 - 5.48 M/uL   Hemoglobin 14.9 12.2 - 16.2 g/dL   HCT, POC 48.3 (A) 37.7 - 47.9 %   MCV 89.9 80 - 97 fL   MCH, POC 27.7 27 - 31.2 pg   MCHC 30.9 (A) 31.8 - 35.4 g/dL   RDW, POC 14.3 %   Platelet Count, POC 236 142 - 424 K/uL   MPV 8.5 0 - 99.8 fL  POCT Influenza A/B  Result Value Ref Range   Influenza A, POC Negative Negative   Influenza B, POC Negative Negative  POCT glucose (manual entry)  Result Value Ref Range   POC Glucose 86 70 - 99 mg/dl     EKG/XRAY:   Primary read interpreted by Dr. Marin Comment at [facility].   ASSESSMENT/PLAN: Encounter Diagnoses  Name Primary?  . Flu-like symptoms Yes  . Diarrhea, unspecified type   . Nausea without vomiting   . Dehydration    IVF x 1 Rx zofran Stool cx and OP and C diff given her to take home, if she is still having diarrhea then need to give me stool samples since need to check for C diff since was on abx for a long time for teeth. I don;t think it is SEs from amox but possible, she had flu like sxs as well not just dirrhea.  Fu prn   Gross sideeffects, risk and benefits, and alternatives of medications d/w patient. Patient is aware that all medications have potential sideeffects and we are unable to predict every sideeffect or drug-drug interaction that may occur.  Tara Walts DO  03/02/2015 12:17 PM  03/03/15-doing much better this AM, no diarrhea, spoke with patient about labs

## 2015-03-02 NOTE — Patient Instructions (Signed)
Food Choices to Help Relieve Diarrhea, Adult When you have diarrhea, the foods you eat and your eating habits are very important. Choosing the right foods and drinks can help relieve diarrhea. Also, because diarrhea can last up to 7 days, you need to replace lost fluids and electrolytes (such as sodium, potassium, and chloride) in order to help prevent dehydration.  WHAT GENERAL GUIDELINES DO I NEED TO FOLLOW?  Slowly drink 1 cup (8 oz) of fluid for each episode of diarrhea. If you are getting enough fluid, your urine will be clear or pale yellow.  Eat starchy foods. Some good choices include white rice, white toast, pasta, low-fiber cereal, baked potatoes (without the skin), saltine crackers, and bagels.  Avoid large servings of any cooked vegetables.  Limit fruit to two servings per day. A serving is  cup or 1 small piece.  Choose foods with less than 2 g of fiber per serving.  Limit fats to less than 8 tsp (38 g) per day.  Avoid fried foods.  Eat foods that have probiotics in them. Probiotics can be found in certain dairy products.  Avoid foods and beverages that may increase the speed at which food moves through the stomach and intestines (gastrointestinal tract). Things to avoid include:  High-fiber foods, such as dried fruit, raw fruits and vegetables, nuts, seeds, and whole grain foods.  Spicy foods and high-fat foods.  Foods and beverages sweetened with high-fructose corn syrup, honey, or sugar alcohols such as xylitol, sorbitol, and mannitol. WHAT FOODS ARE RECOMMENDED? Grains White rice. White, French, or pita breads (fresh or toasted), including plain rolls, buns, or bagels. White pasta. Saltine, soda, or graham crackers. Pretzels. Low-fiber cereal. Cooked cereals made with water (such as cornmeal, farina, or cream cereals). Plain muffins. Matzo. Melba toast. Zwieback.  Vegetables Potatoes (without the skin). Strained tomato and vegetable juices. Most well-cooked and canned  vegetables without seeds. Tender lettuce. Fruits Cooked or canned applesauce, apricots, cherries, fruit cocktail, grapefruit, peaches, pears, or plums. Fresh bananas, apples without skin, cherries, grapes, cantaloupe, grapefruit, peaches, oranges, or plums.  Meat and Other Protein Products Baked or boiled chicken. Eggs. Tofu. Fish. Seafood. Smooth peanut butter. Ground or well-cooked tender beef, ham, veal, lamb, pork, or poultry.  Dairy Plain yogurt, kefir, and unsweetened liquid yogurt. Lactose-free milk, buttermilk, or soy milk. Plain hard cheese. Beverages Sport drinks. Clear broths. Diluted fruit juices (except prune). Regular, caffeine-free sodas such as ginger ale. Water. Decaffeinated teas. Oral rehydration solutions. Sugar-free beverages not sweetened with sugar alcohols. Other Bouillon, broth, or soups made from recommended foods.  The items listed above may not be a complete list of recommended foods or beverages. Contact your dietitian for more options. WHAT FOODS ARE NOT RECOMMENDED? Grains Whole grain, whole wheat, bran, or rye breads, rolls, pastas, crackers, and cereals. Wild or brown rice. Cereals that contain more than 2 g of fiber per serving. Corn tortillas or taco shells. Cooked or dry oatmeal. Granola. Popcorn. Vegetables Raw vegetables. Cabbage, broccoli, Brussels sprouts, artichokes, baked beans, beet greens, corn, kale, legumes, peas, sweet potatoes, and yams. Potato skins. Cooked spinach and cabbage. Fruits Dried fruit, including raisins and dates. Raw fruits. Stewed or dried prunes. Fresh apples with skin, apricots, mangoes, pears, raspberries, and strawberries.  Meat and Other Protein Products Chunky peanut butter. Nuts and seeds. Beans and lentils. Bacon.  Dairy High-fat cheeses. Milk, chocolate milk, and beverages made with milk, such as milk shakes. Cream. Ice cream. Sweets and Desserts Sweet rolls, doughnuts, and sweet breads.   Pancakes and waffles. Fats and  Oils Butter. Cream sauces. Margarine. Salad oils. Plain salad dressings. Olives. Avocados.  Beverages Caffeinated beverages (such as coffee, tea, soda, or energy drinks). Alcoholic beverages. Fruit juices with pulp. Prune juice. Soft drinks sweetened with high-fructose corn syrup or sugar alcohols. Other Coconut. Hot sauce. Chili powder. Mayonnaise. Gravy. Cream-based or milk-based soups.  The items listed above may not be a complete list of foods and beverages to avoid. Contact your dietitian for more information. WHAT SHOULD I DO IF I BECOME DEHYDRATED? Diarrhea can sometimes lead to dehydration. Signs of dehydration include dark urine and dry mouth and skin. If you think you are dehydrated, you should rehydrate with an oral rehydration solution. These solutions can be purchased at pharmacies, retail stores, or online.  Drink -1 cup (120-240 mL) of oral rehydration solution each time you have an episode of diarrhea. If drinking this amount makes your diarrhea worse, try drinking smaller amounts more often. For example, drink 1-3 tsp (5-15 mL) every 5-10 minutes.  A general rule for staying hydrated is to drink 1-2 L of fluid per day. Talk to your health care provider about the specific amount you should be drinking each day. Drink enough fluids to keep your urine clear or pale yellow.   This information is not intended to replace advice given to you by your health care provider. Make sure you discuss any questions you have with your health care provider.   Document Released: 08/03/2003 Document Revised: 06/03/2014 Document Reviewed: 04/05/2013 Elsevier Interactive Patient Education 2016 [place] What is Clostridium difficile infection?  Clostridium difficile [pronounced Klo-STRID-ee-um dif-uh-SEEL], also known as "C. diff" [See-dif], is a germ that can cause diarrhea. Most cases of C. diff infection occur in patients taking antibiotics. The most common symptoms of a C. diff  infection include:  Watery diarrhea  Fever  Loss of appetite  Nausea  Belly pain and tenderness Who is most likely to get C. diff infection? The elderly and people with certain medical problems have the greatest chance of getting C. diff. C. diff spores can live outside the human body for a very long time and may be found on things in the environment such as bed linens, bed rails, bathroom fixtures, and medical equipment. C. diff infection can spread from person-to-person on contaminated equipment and on the hands of doctors, nurses, other healthcare providers and visitors. Can C. diff infection be treated? Yes, there are antibiotics that can be used to treat C. diff. In some severe cases, a person might have to have surgery to remove the infected part of the intestines. This surgery is needed in only 1 or 2 out of every 100 persons with C. diff. What are some of the things that hospitals are doing to prevent C. diff infections? To prevent C. diff infections, doctors, nurses, and other healthcare providers:  Clean their hands with soap and water or an alcohol-based hand rub before and after caring for every patient. This can prevent C. diff and other germs from being passed from one patient to another on their hands.  Carefully clean hospital rooms and medical equipment that have been used for patients with C. diff.  Use Contact Precautions to prevent C. diff from spreading to other patients. Contact Precautions mean:  Whenever possible, patients with C. diff will have a single room or share a room only with someone else who also has C. diff.  Healthcare providers will put on gloves and  wear a gown over their clothing while taking care of patients with C. diff.  Visitors may also be asked to wear a gown and gloves.  When leaving the room, hospital providers and visitors remove their gown and gloves and clean their hands.  Patients on Contact Precautions are asked to stay in their  hospital rooms as much as possible. They should not go to common areas, such as the gift shop or cafeteria. They can go to other areas of the hospital for treatments and tests.  Only give patients antibiotics when it is necessary. What can I do to help prevent C. diff infections?  Make sure that all doctors, nurses, and other healthcare providers clean their hands with soap and water or an alcohol-based hand rub before and after caring for you.  If you do not see your providers clean their hands, please ask them to do so.  Only take antibiotics as prescribed by your doctor.  Be sure to clean your own hands often, especially after using the bathroom and before eating. Can my friends and family get C. diff when they visit me? C. diff infection usually does not occur in persons who are not taking antibiotics. Visitors are not likely to get C. diff. Still, to make it safer for visitors, they should:  Clean their hands before they enter your room and as they leave your room  Ask the nurse if they need to wear protective gowns and gloves when they visit you. What do I need to do when I go home from the hospital? Once you are back at home, you can return to your normal routine. Often, the diarrhea will be better or completely gone before you go home. This makes giving C. diff to other people much less likely. There are a few things you should do, however, to lower the chances of developing C. diff infection again or of spreading it to others.  If you are given a prescription to treat C. diff, take the medicine exactly as prescribed by your doctor and pharmacist. Do not take half-doses or stop before you run out.  Wash your hands often, especially after going to the bathroom and before preparing food.  People who live with you should wash their hands often as well.  If you develop more diarrhea after you get home, tell your doctor immediately.  Your doctor may give you additional  instructions. If you have questions, please ask your doctor or nurse. Developed and co-sponsored by [employer] for [place] [phone]); Infectious Diseases Society of [place] (IDSA); [place]; Association for Professionals in Infection Control and Epidemiology (APIC); Centers for Disease Control and Prevention (CDC); and [employer].   This information is not intended to replace advice given to you by your health care provider. Make sure you discuss any questions you have with your health care provider.   Document Released: 05/18/2013 Document Revised: 09/27/2014 Document Reviewed: 07/27/2014 Elsevier Interactive Patient Education [employer].

## 2015-03-03 ENCOUNTER — Telehealth: Payer: Self-pay | Admitting: Family Medicine

## 2015-03-03 NOTE — Telephone Encounter (Signed)
Patient has some questions about her stool test kit and getting a flu shot.  [phone]

## 2015-03-03 NOTE — Telephone Encounter (Signed)
Spoke with patient, she is better, advise on what to do about flu and stool kit

## 2015-04-12 ENCOUNTER — Ambulatory Visit
Admission: RE | Admit: 2015-04-12 | Discharge: 2015-04-12 | Disposition: A | Discharge status: Home / Self Care | Payer: BC Managed Care – PPO | Source: Ambulatory Visit | Attending: Obstetrics & Gynecology | Admitting: Obstetrics & Gynecology

## 2015-04-12 DIAGNOSIS — D241 Benign neoplasm of right breast: Secondary | ICD-10-CM

## 2015-10-06 ENCOUNTER — Ambulatory Visit (INDEPENDENT_AMBULATORY_CARE_PROVIDER_SITE_OTHER): Payer: BC Managed Care – PPO | Admitting: Family Medicine

## 2015-10-06 VITALS — BP 126/82 | HR 80 | Temp 98.0°F | Resp 18 | Ht 61.5 in | Wt 150.4 lb

## 2015-10-06 DIAGNOSIS — H6091 Unspecified otitis externa, right ear: Secondary | ICD-10-CM | POA: Diagnosis not present

## 2015-10-06 MED ORDER — OFLOXACIN 0.3 % OT SOLN: 10.0000 [drp] | Freq: Every day | OTIC | Status: DC

## 2015-10-06 MED ORDER — AMOXICILLIN 500 MG PO TABS: 1000.0000 mg | ORAL_TABLET | Freq: Two times a day (BID) | ORAL | Status: DC

## 2015-10-06 NOTE — Patient Instructions (Signed)

## 2015-10-06 NOTE — Progress Notes (Signed)
Subjective:    Patient ID: Tara Downs, female    DOB: 1951/05/19, 66 y.o.   MRN: [id number]  10/06/2015  Ear Pain   HPI This 66 y.o. female presents for evaluation of R ear pain. Onset in past few days; denies fever/chills/sweats. Denies drainage from ear; no hearing loss but hearing is muffled. No ringing in ears.  No rhinorrhea, +nasal congestion; +PND; no sore throat.  No cough.    Review of Systems  Constitutional: Negative for fever, chills, diaphoresis and fatigue.  HENT: Positive for congestion and ear pain. Negative for hearing loss, postnasal drip, rhinorrhea, sinus pressure, sore throat and trouble swallowing.   Respiratory: Negative for cough and shortness of breath.   Cardiovascular: Negative for chest pain, palpitations and leg swelling.  Gastrointestinal: Negative for nausea, vomiting, abdominal pain, diarrhea and constipation.    Past Medical History  Diagnosis Date  . Allergy   . Arthritis   . Interstitial cystitis   . Psoriasis    Past Surgical History  Procedure Laterality Date  . Cervical laminectomy    . Cervical fusion     Allergies  Allergen Reactions  . Codeine Nausea And Vomiting  . Tetracyclines & Related Other (See Comments)    Dematologist told pt to never take tetracycline, drug interactions     Social History   Social History  . Marital Status: Married    Spouse Name: N/A  . Number of Children: N/A  . Years of Education: N/A   Occupational History  . Not on file.   Social History Main Topics  . Smoking status: Current Every Day Smoker -- 0.50 packs/day for 20 years    Types: Cigarettes  . Smokeless tobacco: Never Used  . Alcohol Use: No  . Drug Use: No  . Sexual Activity: Yes   Other Topics Concern  . Not on file   Social History Narrative   History reviewed. No pertinent family history.     Objective:    BP 126/82 mmHg  Pulse 80  Temp(Src) 98 F (36.7 C) (Oral)  Resp 18  Ht 5' 1.5" (1.562 m)  Wt 150 lb 6.4  oz (68.221 kg)  BMI 27.96 kg/m2  SpO2 96% Physical Exam  Constitutional: She is oriented to person, place, and time. She appears well-developed and well-nourished. No distress.  HENT:  Head: Normocephalic and atraumatic.  Right Ear: Tympanic membrane normal. No lacerations. There is swelling and tenderness. No foreign bodies. No mastoid tenderness. Tympanic membrane is not injected, not scarred, not perforated, not erythematous, not retracted and not bulging. No middle ear effusion.  Left Ear: Tympanic membrane, external ear and ear canal normal.  Nose: Nose normal.  Mouth/Throat: Oropharynx is clear and moist. No oropharyngeal exudate.  Eyes: Conjunctivae and EOM are normal. Pupils are equal, round, and reactive to light.  Neck: Normal range of motion. Neck supple. No thyromegaly present.  Cardiovascular: Normal rate, regular rhythm and normal heart sounds.  Exam reveals no gallop and no friction rub.   No murmur heard. Pulmonary/Chest: Effort normal and breath sounds normal. She has no wheezes. She has no rales.  Lymphadenopathy:    She has cervical adenopathy.  Neurological: She is alert and oriented to person, place, and time.  Skin: She is not diaphoretic.  Psychiatric: She has a normal mood and affect. Her behavior is normal.  Nursing note and vitals reviewed.       Assessment & Plan:   1. Recurrent otitis externa, right  No orders of the defined types were placed in this encounter.   Meds ordered this encounter  Medications  . amoxicillin (AMOXIL) 500 MG tablet    Sig: Take 2 tablets (1,000 mg total) by mouth 2 (two) times daily.    Dispense:  40 tablet    Refill:  0  . ofloxacin (FLOXIN) 0.3 % otic solution    Sig: Place 10 drops into the right ear daily.    Dispense:  10 mL    Refill:  0    No Follow-up on file.    Dezi Schaner Elayne Guerin, M.D. [facility] [address] [place], [place]  [address] [phone] phone [phone] fax

## 2015-12-13 ENCOUNTER — Telehealth: Payer: Self-pay

## 2015-12-14 ENCOUNTER — Ambulatory Visit (INDEPENDENT_AMBULATORY_CARE_PROVIDER_SITE_OTHER): Payer: BC Managed Care – PPO | Admitting: Family Medicine

## 2015-12-14 VITALS — BP 120/80 | HR 83 | Temp 98.5°F | Resp 17 | Ht 62.0 in | Wt 150.0 lb

## 2015-12-14 DIAGNOSIS — H109 Unspecified conjunctivitis: Secondary | ICD-10-CM | POA: Diagnosis not present

## 2015-12-14 DIAGNOSIS — J029 Acute pharyngitis, unspecified: Secondary | ICD-10-CM | POA: Diagnosis not present

## 2015-12-14 LAB — POCT CBC
Granulocyte percent: 59.1 %G (ref 37–80)
HCT, POC: 41.4 % (ref 37.7–47.9)
HEMOGLOBIN: 14.1 g/dL (ref 12.2–16.2)
LYMPH, POC: 2.7 (ref 0.6–3.4)
MCH: 30.2 pg (ref 27–31.2)
MCHC: 34.1 g/dL (ref 31.8–35.4)
MCV: 88.6 fL (ref 80–97)
MID (cbc): 0.7 (ref 0–0.9)
MPV: 7.4 fL (ref 0–99.8)
PLATELET COUNT, POC: 290 10*3/uL (ref 142–424)
POC Granulocyte: 4.8 (ref 2–6.9)
POC LYMPH PERCENT: 32.9 %L (ref 10–50)
POC MID %: 8 % (ref 0–12)
RBC: 4.67 M/uL (ref 4.04–5.48)
RDW, POC: 13 %
WBC: 8.2 10*3/uL (ref 4.6–10.2)

## 2015-12-14 LAB — POCT RAPID STREP A (OFFICE): RAPID STREP A SCREEN: NEGATIVE

## 2015-12-14 MED ORDER — POLYMYXIN B-TRIMETHOPRIM 10000-0.1 UNIT/ML-% OP SOLN: 2.0000 [drp] | Freq: Four times a day (QID) | OPHTHALMIC | Status: AC

## 2015-12-14 NOTE — Progress Notes (Signed)
Subjective:    Patient ID: ILO HARTE, female    DOB: 09-26-1950, 65 y.o.   MRN: [id number]  12/14/2015  Eye Pain   HPI This 65 y.o. female presents for evaluation of eye pain and sore throat.  Onset four days ago.  No fever but +chills; no sweats.  No headache.  No ear pain.  +ST diffuse; +pain with swallowing.  No rhinorrhea; +chronic nasal congestion year round.  No cough.  +FB sensation on R; +blurred; no photophobia. +tearing; not sur if drainage.  Mild redness in corner.  No contact lens.  No sick contact but works around a lot of people.  No medications.  No eye drops or visine. No n/v/d.   Review of Systems  Constitutional: Positive for chills. Negative for diaphoresis, fatigue and fever.  HENT: Positive for congestion and sore throat. Negative for postnasal drip and rhinorrhea.   Eyes: Positive for pain and redness. Negative for photophobia, itching and visual disturbance.  Respiratory: Negative for cough and shortness of breath.   Cardiovascular: Negative for chest pain, palpitations and leg swelling.  Gastrointestinal: Negative for nausea, vomiting, abdominal pain, diarrhea and constipation.  Endocrine: Negative for cold intolerance, heat intolerance, polydipsia, polyphagia and polyuria.  Neurological: Positive for headaches. Negative for dizziness, tremors, seizures, syncope, facial asymmetry, speech difficulty, weakness, light-headedness and numbness.    Past Medical History:  Diagnosis Date  . Allergy   . Arthritis   . Interstitial cystitis   . Psoriasis    Past Surgical History:  Procedure Laterality Date  . CERVICAL FUSION    . CERVICAL LAMINECTOMY     Allergies  Allergen Reactions  . Codeine Nausea And Vomiting  . Tetracyclines & Related Other (See Comments)    Dematologist told pt to never take tetracycline, drug interactions     Social History   Social History  . Marital status: Married    Spouse name: N/A  . Number of children: N/A  . Years of  education: N/A   Occupational History  . Not on file.   Social History Main Topics  . Smoking status: Current Every Day Smoker    Packs/day: 0.50    Years: 20.00    Types: Cigarettes  . Smokeless tobacco: Never Used  . Alcohol use No  . Drug use: No  . Sexual activity: Yes   Other Topics Concern  . Not on file   Social History Narrative  . No narrative on file   History reviewed. No pertinent family history.     Objective:    BP 120/80   Pulse 83   Temp 98.5 F (36.9 C) (Oral)   Resp 17   Ht 5\' 2"  (1.575 m)   Wt 150 lb (68 kg)   SpO2 96%   BMI 27.44 kg/m   Physical Exam  Constitutional: She is oriented to person, place, and time. She appears well-developed and well-nourished. No distress.  HENT:  Head: Normocephalic and atraumatic.  Right Ear: External ear normal.  Left Ear: External ear normal.  Nose: Nose normal.  Mouth/Throat: Oropharynx is clear and moist. No oropharyngeal exudate.  Eyes: EOM are normal. Pupils are equal, round, and reactive to light. Right conjunctiva is injected. Right conjunctiva has no hemorrhage.  Slit lamp exam:      The right eye shows no corneal abrasion, no corneal flare, no corneal ulcer, no foreign body, no hyphema, no fluorescein uptake and no anterior chamber bulge.  Neck: Normal range of motion. Neck supple. Carotid  bruit is not present. No thyromegaly present.  Cardiovascular: Normal rate, regular rhythm, normal heart sounds and intact distal pulses.  Exam reveals no gallop and no friction rub.   No murmur heard. Pulmonary/Chest: Effort normal and breath sounds normal. She has no wheezes. She has no rales.  Abdominal: Soft. Bowel sounds are normal.  Lymphadenopathy:    She has no cervical adenopathy.  Neurological: She is alert and oriented to person, place, and time. No cranial nerve deficit.  Skin: Skin is warm and dry. No rash noted. She is not diaphoretic. No erythema. No pallor.  Psychiatric: She has a normal mood and  affect. Her behavior is normal.   R EYE: no foreign body identified; no corneal abrasion; no fluoroscein update.  Eye irrigated with sterile water; pt tolerated procedure well. Upper lid edematous mildly with mild erythema.     Assessment & Plan:   1. Sore throat   2. Bilateral conjunctivitis    -new. -consistent with viral syndrome based on negative rapid strep and normal CBC.  Highly suggestive of adenovirus with sore throat and conjunctivitis. -pt very upset that oral abx not prescribed "as it always is when I present with a sore throat".  -I discussed abx stewardship with patient; I attempted to educate on viral infections; patient continued to be upset with lack of abx treatment.  -supportive care with rest, fluids, Ibuprofen and/or Tylenol recommended. -RTC inability to swallow -rx for Polytrim provided due to edematous eyelid.   Orders Placed This Encounter  Procedures  . Culture, Group A Strep    Order Specific Question:   Source    Answer:   oropharynx  . POCT rapid strep A  . POCT CBC   Meds ordered this encounter  Medications  . trimethoprim-polymyxin b (POLYTRIM) ophthalmic solution    Sig: Place 2 drops into the right eye every 6 (six) hours.    Dispense:  10 mL    Refill:  0    No Follow-up on file.    Karris Deangelo Elayne Guerin, M.D. [facility] [address] [place], [place]  [address] [phone] phone [phone] fax

## 2015-12-14 NOTE — Patient Instructions (Signed)
IF you received an x-ray today, you will receive an invoice from [facility]. Please contact [facility] at [phone] with questions or concerns regarding your invoice.   IF you received labwork today, you will receive an invoice from [employer]. Please contact [facility] at [phone] with questions or concerns regarding your invoice.   Our billing staff will not be able to assist you with questions regarding bills from these companies.  You will be contacted with the lab results as soon as they are available. The fastest way to get your results is to activate your My Chart account. Instructions are located on the last page of this paperwork. If you have not heard from [place] regarding the results in 2 weeks, please contact this office.       Adenovirus Adenoviruses are common viruses that cause many different types of infections. The common cold (upper respiratory infection) is the most common type of infection from an adenovirus. Adenoviruses can also infect your digestive system, eyes, lungs, and bladder. An adenovirus spreads easily from person to person. This is especially true if you are in close contact with someone who is sick. You can breathe in the virus after a sick person coughs or sneezes. Adenoviruses can live outside the body for many weeks. Therefore, you can also get sick after touching an object the virus is living on and then touching your nose or mouth. Adenovirus infections are usually not serious unless you have another health problem that makes it hard for you to fight off the infection.  CAUSES  There are more than 50 types of adenoviruses that can cause infections in humans. Different types of adenoviruses cause different types of infection.  RISK FACTORS The risk for an adenovirus infection is higher for:  People who spend a lot of time in places where there are lots of other people. This includes schools, summer  camps, and day care centers.  Babies.  Elderly people.  People with a weak body defense system (immune system).  People with heart or lung disease. SIGNS AND SYMPTOMS  It can take 2-14 days to develop symptoms after the virus gets into your body. Symptoms may include:  Fever.  Sore throat.  Ear pain or fullness.  Nasal congestion.  Cough.  Difficulty breathing.  Stomachache.  Diarrhea.  Pain while passing urine.  Blood in the urine.  Pinkeye (conjunctivitis). DIAGNOSIS  Your health care provider may diagnose an adenovirus infection from your signs and symptoms. A physical exam will also be done. You may have tests to make sure your symptoms are not caused by another type of problem. These can include a blood test, throat culture, or chest X-ray. TREATMENT  There is no treatment for an adenovirus infection. These infections usually clear up on their own with home care. Your health care provider may recommend over-the-counter medicine to help relieve a sore throat, fever, or headache. HOME CARE INSTRUCTIONS  Rest at home until your symptoms go away.  Drink enough fluid to keep your urine clear or pale yellow.  Take medicines only as directed by your health care provider. PREVENTION   Wash your hands often with soap and water.  Avoid close contact with people who are sick.  Do not go to school or work when you are sick.  Cover your nose or mouth when you sneeze or cough. SEEK MEDICAL CARE IF: Your symptoms of adenovirus infection do not clear up or are getting worse after several days.  SEEK IMMEDIATE MEDICAL CARE IF: You have trouble breathing. MAKE SURE YOU:  Understand these instructions.  Will watch your condition.  Will get help right away if you are not doing well or get worse.   This information is not intended to replace advice given to you by your health care provider. Make sure you discuss any questions you have with your health care provider.    Document Released: 08/03/2002 Document Revised: 06/03/2014 Document Reviewed: 09/15/2013 Elsevier Interactive Patient Education [employer].

## 2015-12-16 LAB — CULTURE, GROUP A STREP: ORGANISM ID, BACTERIA: NORMAL

## 2016-02-02 ENCOUNTER — Encounter: Payer: Self-pay | Admitting: Family Medicine

## 2016-04-09 ENCOUNTER — Other Ambulatory Visit: Payer: Self-pay | Admitting: Family Medicine

## 2016-04-09 DIAGNOSIS — D241 Benign neoplasm of right breast: Secondary | ICD-10-CM

## 2016-04-10 ENCOUNTER — Other Ambulatory Visit: Payer: Self-pay | Admitting: Family Medicine

## 2016-04-10 DIAGNOSIS — R911 Solitary pulmonary nodule: Secondary | ICD-10-CM

## 2016-04-23 ENCOUNTER — Ambulatory Visit
Admission: RE | Admit: 2016-04-23 | Discharge: 2016-04-23 | Disposition: A | Discharge status: Home / Self Care | Payer: BC Managed Care – PPO | Source: Ambulatory Visit | Attending: Family Medicine | Admitting: Family Medicine

## 2016-04-23 DIAGNOSIS — R911 Solitary pulmonary nodule: Secondary | ICD-10-CM

## 2016-05-23 ENCOUNTER — Other Ambulatory Visit: Payer: BC Managed Care – PPO

## 2016-06-26 ENCOUNTER — Ambulatory Visit
Admission: RE | Admit: 2016-06-26 | Discharge: 2016-06-26 | Disposition: A | Discharge status: Home / Self Care | Payer: BC Managed Care – PPO | Source: Ambulatory Visit | Attending: Family Medicine | Admitting: Family Medicine

## 2016-06-26 DIAGNOSIS — D241 Benign neoplasm of right breast: Secondary | ICD-10-CM

## 2016-07-23 NOTE — Telephone Encounter (Signed)
close

## 2017-08-21 ENCOUNTER — Other Ambulatory Visit: Payer: Self-pay | Admitting: Family Medicine

## 2017-08-21 DIAGNOSIS — Z1231 Encounter for screening mammogram for malignant neoplasm of breast: Secondary | ICD-10-CM

## 2017-10-21 ENCOUNTER — Encounter: Payer: Self-pay | Admitting: Family Medicine

## 2017-10-22 ENCOUNTER — Ambulatory Visit
Admission: RE | Admit: 2017-10-22 | Discharge: 2017-10-22 | Disposition: A | Discharge status: Home / Self Care | Payer: BC Managed Care – PPO | Source: Ambulatory Visit | Attending: Family Medicine | Admitting: Family Medicine

## 2017-10-22 DIAGNOSIS — Z1231 Encounter for screening mammogram for malignant neoplasm of breast: Secondary | ICD-10-CM

## 2019-01-27 ENCOUNTER — Other Ambulatory Visit: Payer: Self-pay | Admitting: Family Medicine

## 2019-01-27 DIAGNOSIS — Z1231 Encounter for screening mammogram for malignant neoplasm of breast: Secondary | ICD-10-CM

## 2019-03-12 ENCOUNTER — Ambulatory Visit: Payer: BC Managed Care – PPO

## 2019-05-07 ENCOUNTER — Ambulatory Visit
Admission: RE | Admit: 2019-05-07 | Discharge: 2019-05-07 | Disposition: A | Discharge status: Home / Self Care | Payer: BC Managed Care – PPO | Source: Ambulatory Visit | Attending: Family Medicine | Admitting: Family Medicine

## 2019-05-07 ENCOUNTER — Other Ambulatory Visit: Payer: Self-pay

## 2019-05-07 DIAGNOSIS — Z1231 Encounter for screening mammogram for malignant neoplasm of breast: Secondary | ICD-10-CM

## 2020-02-02 DIAGNOSIS — L578 Other skin changes due to chronic exposure to nonionizing radiation: Secondary | ICD-10-CM | POA: Diagnosis not present

## 2020-02-02 DIAGNOSIS — Z5181 Encounter for therapeutic drug level monitoring: Secondary | ICD-10-CM | POA: Diagnosis not present

## 2020-02-02 DIAGNOSIS — Z79899 Other long term (current) drug therapy: Secondary | ICD-10-CM | POA: Diagnosis not present

## 2020-02-02 DIAGNOSIS — L403 Pustulosis palmaris et plantaris: Secondary | ICD-10-CM | POA: Diagnosis not present

## 2020-03-21 ENCOUNTER — Other Ambulatory Visit: Payer: Self-pay | Admitting: Family Medicine

## 2020-03-21 DIAGNOSIS — Z1231 Encounter for screening mammogram for malignant neoplasm of breast: Secondary | ICD-10-CM

## 2020-03-22 DIAGNOSIS — Z23 Encounter for immunization: Secondary | ICD-10-CM | POA: Diagnosis not present

## 2020-03-24 DIAGNOSIS — N3010 Interstitial cystitis (chronic) without hematuria: Secondary | ICD-10-CM | POA: Diagnosis not present

## 2020-03-24 DIAGNOSIS — R102 Pelvic and perineal pain: Secondary | ICD-10-CM | POA: Diagnosis not present

## 2020-03-24 DIAGNOSIS — N3020 Other chronic cystitis without hematuria: Secondary | ICD-10-CM | POA: Diagnosis not present

## 2020-03-28 DIAGNOSIS — Z124 Encounter for screening for malignant neoplasm of cervix: Secondary | ICD-10-CM | POA: Diagnosis not present

## 2020-03-28 DIAGNOSIS — R102 Pelvic and perineal pain: Secondary | ICD-10-CM | POA: Diagnosis not present

## 2020-03-28 DIAGNOSIS — Z6829 Body mass index (BMI) 29.0-29.9, adult: Secondary | ICD-10-CM | POA: Diagnosis not present

## 2020-05-08 ENCOUNTER — Ambulatory Visit
Admission: RE | Admit: 2020-05-08 | Discharge: 2020-05-08 | Disposition: A | Discharge status: Home / Self Care | Payer: Medicare PPO | Source: Ambulatory Visit | Attending: Family Medicine | Admitting: Family Medicine

## 2020-05-08 ENCOUNTER — Other Ambulatory Visit: Payer: Self-pay

## 2020-05-08 DIAGNOSIS — Z1231 Encounter for screening mammogram for malignant neoplasm of breast: Secondary | ICD-10-CM | POA: Diagnosis not present

## 2020-07-19 DIAGNOSIS — N3000 Acute cystitis without hematuria: Secondary | ICD-10-CM | POA: Diagnosis not present

## 2020-07-19 DIAGNOSIS — R102 Pelvic and perineal pain: Secondary | ICD-10-CM | POA: Diagnosis not present

## 2020-07-19 DIAGNOSIS — N3010 Interstitial cystitis (chronic) without hematuria: Secondary | ICD-10-CM | POA: Diagnosis not present

## 2020-09-05 DIAGNOSIS — R7303 Prediabetes: Secondary | ICD-10-CM | POA: Diagnosis not present

## 2020-09-05 DIAGNOSIS — J209 Acute bronchitis, unspecified: Secondary | ICD-10-CM | POA: Diagnosis not present

## 2020-09-05 DIAGNOSIS — E7800 Pure hypercholesterolemia, unspecified: Secondary | ICD-10-CM | POA: Diagnosis not present

## 2020-09-05 DIAGNOSIS — N3010 Interstitial cystitis (chronic) without hematuria: Secondary | ICD-10-CM | POA: Diagnosis not present

## 2020-09-05 DIAGNOSIS — J301 Allergic rhinitis due to pollen: Secondary | ICD-10-CM | POA: Diagnosis not present

## 2020-09-05 DIAGNOSIS — I10 Essential (primary) hypertension: Secondary | ICD-10-CM | POA: Diagnosis not present

## 2020-09-05 DIAGNOSIS — L409 Psoriasis, unspecified: Secondary | ICD-10-CM | POA: Diagnosis not present

## 2020-09-05 DIAGNOSIS — Z0000 Encounter for general adult medical examination without abnormal findings: Secondary | ICD-10-CM | POA: Diagnosis not present

## 2020-09-05 DIAGNOSIS — K594 Anal spasm: Secondary | ICD-10-CM | POA: Diagnosis not present

## 2020-10-04 DIAGNOSIS — Z5181 Encounter for therapeutic drug level monitoring: Secondary | ICD-10-CM | POA: Diagnosis not present

## 2020-10-04 DIAGNOSIS — L403 Pustulosis palmaris et plantaris: Secondary | ICD-10-CM | POA: Diagnosis not present

## 2020-10-04 DIAGNOSIS — Z79899 Other long term (current) drug therapy: Secondary | ICD-10-CM | POA: Diagnosis not present

## 2020-12-04 DIAGNOSIS — D122 Benign neoplasm of ascending colon: Secondary | ICD-10-CM | POA: Diagnosis not present

## 2020-12-04 DIAGNOSIS — Z1211 Encounter for screening for malignant neoplasm of colon: Secondary | ICD-10-CM | POA: Diagnosis not present

## 2020-12-04 DIAGNOSIS — D124 Benign neoplasm of descending colon: Secondary | ICD-10-CM | POA: Diagnosis not present

## 2020-12-04 DIAGNOSIS — K635 Polyp of colon: Secondary | ICD-10-CM | POA: Diagnosis not present

## 2020-12-04 DIAGNOSIS — D120 Benign neoplasm of cecum: Secondary | ICD-10-CM | POA: Diagnosis not present

## 2020-12-04 DIAGNOSIS — K648 Other hemorrhoids: Secondary | ICD-10-CM | POA: Diagnosis not present

## 2020-12-04 DIAGNOSIS — D123 Benign neoplasm of transverse colon: Secondary | ICD-10-CM | POA: Diagnosis not present

## 2020-12-04 DIAGNOSIS — K5730 Diverticulosis of large intestine without perforation or abscess without bleeding: Secondary | ICD-10-CM | POA: Diagnosis not present

## 2020-12-28 ENCOUNTER — Other Ambulatory Visit: Payer: Self-pay | Admitting: Family Medicine

## 2020-12-28 DIAGNOSIS — Z1231 Encounter for screening mammogram for malignant neoplasm of breast: Secondary | ICD-10-CM

## 2020-12-28 DIAGNOSIS — H43812 Vitreous degeneration, left eye: Secondary | ICD-10-CM | POA: Diagnosis not present

## 2021-01-10 DIAGNOSIS — R1031 Right lower quadrant pain: Secondary | ICD-10-CM | POA: Diagnosis not present

## 2021-01-10 DIAGNOSIS — Z86010 Personal history of colonic polyps: Secondary | ICD-10-CM | POA: Diagnosis not present

## 2021-01-16 DIAGNOSIS — R1031 Right lower quadrant pain: Secondary | ICD-10-CM | POA: Diagnosis not present

## 2021-01-16 DIAGNOSIS — N3020 Other chronic cystitis without hematuria: Secondary | ICD-10-CM | POA: Diagnosis not present

## 2021-01-23 DIAGNOSIS — R109 Unspecified abdominal pain: Secondary | ICD-10-CM | POA: Diagnosis not present

## 2021-01-23 DIAGNOSIS — G8929 Other chronic pain: Secondary | ICD-10-CM | POA: Diagnosis not present

## 2021-01-23 DIAGNOSIS — R1031 Right lower quadrant pain: Secondary | ICD-10-CM | POA: Diagnosis not present

## 2021-02-02 DIAGNOSIS — H43812 Vitreous degeneration, left eye: Secondary | ICD-10-CM | POA: Diagnosis not present

## 2021-02-08 DIAGNOSIS — N200 Calculus of kidney: Secondary | ICD-10-CM | POA: Diagnosis not present

## 2021-02-08 DIAGNOSIS — N3010 Interstitial cystitis (chronic) without hematuria: Secondary | ICD-10-CM | POA: Diagnosis not present

## 2021-02-13 DIAGNOSIS — D126 Benign neoplasm of colon, unspecified: Secondary | ICD-10-CM | POA: Diagnosis not present

## 2021-02-15 ENCOUNTER — Ambulatory Visit
Admission: RE | Admit: 2021-02-15 | Discharge: 2021-02-15 | Disposition: A | Discharge status: Home / Self Care | Payer: Medicare PPO | Source: Ambulatory Visit | Attending: Family Medicine | Admitting: Family Medicine

## 2021-02-15 ENCOUNTER — Other Ambulatory Visit: Payer: Self-pay

## 2021-02-15 ENCOUNTER — Other Ambulatory Visit: Payer: Self-pay | Admitting: Family Medicine

## 2021-02-15 DIAGNOSIS — I10 Essential (primary) hypertension: Secondary | ICD-10-CM | POA: Diagnosis not present

## 2021-02-15 DIAGNOSIS — R1031 Right lower quadrant pain: Secondary | ICD-10-CM | POA: Diagnosis not present

## 2021-02-27 DIAGNOSIS — Z23 Encounter for immunization: Secondary | ICD-10-CM | POA: Diagnosis not present

## 2021-02-27 DIAGNOSIS — J0100 Acute maxillary sinusitis, unspecified: Secondary | ICD-10-CM | POA: Diagnosis not present

## 2021-02-27 DIAGNOSIS — R1031 Right lower quadrant pain: Secondary | ICD-10-CM | POA: Diagnosis not present

## 2021-02-27 DIAGNOSIS — I10 Essential (primary) hypertension: Secondary | ICD-10-CM | POA: Diagnosis not present

## 2021-03-29 DIAGNOSIS — Z01419 Encounter for gynecological examination (general) (routine) without abnormal findings: Secondary | ICD-10-CM | POA: Diagnosis not present

## 2021-03-29 DIAGNOSIS — Z6829 Body mass index (BMI) 29.0-29.9, adult: Secondary | ICD-10-CM | POA: Diagnosis not present

## 2021-03-29 DIAGNOSIS — Z124 Encounter for screening for malignant neoplasm of cervix: Secondary | ICD-10-CM | POA: Diagnosis not present

## 2021-04-04 DIAGNOSIS — M1990 Unspecified osteoarthritis, unspecified site: Secondary | ICD-10-CM | POA: Diagnosis not present

## 2021-04-04 DIAGNOSIS — D126 Benign neoplasm of colon, unspecified: Secondary | ICD-10-CM | POA: Diagnosis not present

## 2021-04-04 DIAGNOSIS — K589 Irritable bowel syndrome without diarrhea: Secondary | ICD-10-CM | POA: Diagnosis not present

## 2021-04-04 DIAGNOSIS — I10 Essential (primary) hypertension: Secondary | ICD-10-CM | POA: Diagnosis not present

## 2021-04-04 DIAGNOSIS — F17210 Nicotine dependence, cigarettes, uncomplicated: Secondary | ICD-10-CM | POA: Diagnosis not present

## 2021-04-05 DIAGNOSIS — D126 Benign neoplasm of colon, unspecified: Secondary | ICD-10-CM | POA: Diagnosis not present

## 2021-04-11 DIAGNOSIS — L401 Generalized pustular psoriasis: Secondary | ICD-10-CM | POA: Diagnosis not present

## 2021-04-11 DIAGNOSIS — L403 Pustulosis palmaris et plantaris: Secondary | ICD-10-CM | POA: Diagnosis not present

## 2021-04-11 DIAGNOSIS — L578 Other skin changes due to chronic exposure to nonionizing radiation: Secondary | ICD-10-CM | POA: Diagnosis not present

## 2021-04-11 DIAGNOSIS — Z5181 Encounter for therapeutic drug level monitoring: Secondary | ICD-10-CM | POA: Diagnosis not present

## 2021-04-12 DIAGNOSIS — D126 Benign neoplasm of colon, unspecified: Secondary | ICD-10-CM | POA: Diagnosis not present

## 2021-05-03 DIAGNOSIS — K635 Polyp of colon: Secondary | ICD-10-CM | POA: Diagnosis not present

## 2021-05-03 DIAGNOSIS — Z9049 Acquired absence of other specified parts of digestive tract: Secondary | ICD-10-CM | POA: Diagnosis not present

## 2021-05-03 DIAGNOSIS — I700 Atherosclerosis of aorta: Secondary | ICD-10-CM | POA: Diagnosis not present

## 2021-05-03 DIAGNOSIS — N3010 Interstitial cystitis (chronic) without hematuria: Secondary | ICD-10-CM | POA: Diagnosis not present

## 2021-05-03 DIAGNOSIS — R3915 Urgency of urination: Secondary | ICD-10-CM | POA: Diagnosis not present

## 2021-05-03 DIAGNOSIS — Z79899 Other long term (current) drug therapy: Secondary | ICD-10-CM | POA: Diagnosis not present

## 2021-05-03 DIAGNOSIS — I10 Essential (primary) hypertension: Secondary | ICD-10-CM | POA: Diagnosis not present

## 2021-05-03 DIAGNOSIS — D126 Benign neoplasm of colon, unspecified: Secondary | ICD-10-CM | POA: Diagnosis not present

## 2021-05-03 DIAGNOSIS — F17210 Nicotine dependence, cigarettes, uncomplicated: Secondary | ICD-10-CM | POA: Diagnosis not present

## 2021-05-03 DIAGNOSIS — M1990 Unspecified osteoarthritis, unspecified site: Secondary | ICD-10-CM | POA: Diagnosis not present

## 2021-05-09 ENCOUNTER — Ambulatory Visit: Payer: Medicare PPO

## 2021-05-30 DIAGNOSIS — Z9049 Acquired absence of other specified parts of digestive tract: Secondary | ICD-10-CM | POA: Diagnosis not present

## 2021-05-30 DIAGNOSIS — Z09 Encounter for follow-up examination after completed treatment for conditions other than malignant neoplasm: Secondary | ICD-10-CM | POA: Diagnosis not present

## 2021-06-15 ENCOUNTER — Ambulatory Visit: Payer: Medicare PPO

## 2021-06-22 ENCOUNTER — Ambulatory Visit
Admission: RE | Admit: 2021-06-22 | Discharge: 2021-06-22 | Disposition: A | Discharge status: Home / Self Care | Payer: Medicare PPO | Source: Ambulatory Visit | Attending: Family Medicine | Admitting: Family Medicine

## 2021-06-22 DIAGNOSIS — Z1231 Encounter for screening mammogram for malignant neoplasm of breast: Secondary | ICD-10-CM

## 2021-07-18 DIAGNOSIS — Z5181 Encounter for therapeutic drug level monitoring: Secondary | ICD-10-CM | POA: Diagnosis not present

## 2021-07-18 DIAGNOSIS — L401 Generalized pustular psoriasis: Secondary | ICD-10-CM | POA: Diagnosis not present

## 2021-07-18 DIAGNOSIS — L403 Pustulosis palmaris et plantaris: Secondary | ICD-10-CM | POA: Diagnosis not present

## 2021-07-18 DIAGNOSIS — J3489 Other specified disorders of nose and nasal sinuses: Secondary | ICD-10-CM | POA: Diagnosis not present

## 2021-07-18 DIAGNOSIS — Z79899 Other long term (current) drug therapy: Secondary | ICD-10-CM | POA: Diagnosis not present

## 2021-07-30 DIAGNOSIS — I10 Essential (primary) hypertension: Secondary | ICD-10-CM | POA: Diagnosis not present

## 2021-07-30 DIAGNOSIS — R7303 Prediabetes: Secondary | ICD-10-CM | POA: Diagnosis not present

## 2021-07-30 DIAGNOSIS — E782 Mixed hyperlipidemia: Secondary | ICD-10-CM | POA: Diagnosis not present

## 2021-07-31 DIAGNOSIS — H524 Presbyopia: Secondary | ICD-10-CM | POA: Diagnosis not present

## 2021-07-31 DIAGNOSIS — H2513 Age-related nuclear cataract, bilateral: Secondary | ICD-10-CM | POA: Diagnosis not present

## 2021-09-18 DIAGNOSIS — E782 Mixed hyperlipidemia: Secondary | ICD-10-CM | POA: Diagnosis not present

## 2021-09-18 DIAGNOSIS — J309 Allergic rhinitis, unspecified: Secondary | ICD-10-CM | POA: Diagnosis not present

## 2021-09-18 DIAGNOSIS — R197 Diarrhea, unspecified: Secondary | ICD-10-CM | POA: Diagnosis not present

## 2021-09-18 DIAGNOSIS — Z0000 Encounter for general adult medical examination without abnormal findings: Secondary | ICD-10-CM | POA: Diagnosis not present

## 2021-09-18 DIAGNOSIS — I10 Essential (primary) hypertension: Secondary | ICD-10-CM | POA: Diagnosis not present

## 2021-09-18 DIAGNOSIS — R7303 Prediabetes: Secondary | ICD-10-CM | POA: Diagnosis not present

## 2021-12-24 DIAGNOSIS — E7800 Pure hypercholesterolemia, unspecified: Secondary | ICD-10-CM | POA: Diagnosis not present

## 2022-01-09 DIAGNOSIS — M542 Cervicalgia: Secondary | ICD-10-CM | POA: Diagnosis not present

## 2022-01-09 DIAGNOSIS — M25511 Pain in right shoulder: Secondary | ICD-10-CM | POA: Diagnosis not present

## 2022-01-23 DIAGNOSIS — L401 Generalized pustular psoriasis: Secondary | ICD-10-CM | POA: Diagnosis not present

## 2022-01-23 DIAGNOSIS — L578 Other skin changes due to chronic exposure to nonionizing radiation: Secondary | ICD-10-CM | POA: Diagnosis not present

## 2022-01-23 DIAGNOSIS — L403 Pustulosis palmaris et plantaris: Secondary | ICD-10-CM | POA: Diagnosis not present

## 2022-01-23 DIAGNOSIS — Z5181 Encounter for therapeutic drug level monitoring: Secondary | ICD-10-CM | POA: Diagnosis not present

## 2022-02-12 DIAGNOSIS — N200 Calculus of kidney: Secondary | ICD-10-CM | POA: Diagnosis not present

## 2022-02-12 DIAGNOSIS — N3010 Interstitial cystitis (chronic) without hematuria: Secondary | ICD-10-CM | POA: Diagnosis not present

## 2022-03-21 DIAGNOSIS — J309 Allergic rhinitis, unspecified: Secondary | ICD-10-CM | POA: Diagnosis not present

## 2022-03-21 DIAGNOSIS — R197 Diarrhea, unspecified: Secondary | ICD-10-CM | POA: Diagnosis not present

## 2022-03-21 DIAGNOSIS — M25511 Pain in right shoulder: Secondary | ICD-10-CM | POA: Diagnosis not present

## 2022-03-21 DIAGNOSIS — Z23 Encounter for immunization: Secondary | ICD-10-CM | POA: Diagnosis not present

## 2022-03-21 DIAGNOSIS — R7303 Prediabetes: Secondary | ICD-10-CM | POA: Diagnosis not present

## 2022-03-21 DIAGNOSIS — I10 Essential (primary) hypertension: Secondary | ICD-10-CM | POA: Diagnosis not present

## 2022-03-21 DIAGNOSIS — E782 Mixed hyperlipidemia: Secondary | ICD-10-CM | POA: Diagnosis not present

## 2022-03-21 DIAGNOSIS — M7050 Other bursitis of knee, unspecified knee: Secondary | ICD-10-CM | POA: Diagnosis not present

## 2022-05-31 DIAGNOSIS — J329 Chronic sinusitis, unspecified: Secondary | ICD-10-CM | POA: Diagnosis not present

## 2022-06-05 ENCOUNTER — Other Ambulatory Visit: Payer: Self-pay | Admitting: Family Medicine

## 2022-06-05 DIAGNOSIS — Z1231 Encounter for screening mammogram for malignant neoplasm of breast: Secondary | ICD-10-CM

## 2022-07-09 DIAGNOSIS — N958 Other specified menopausal and perimenopausal disorders: Secondary | ICD-10-CM | POA: Diagnosis not present

## 2022-07-09 DIAGNOSIS — R29890 Loss of height: Secondary | ICD-10-CM | POA: Diagnosis not present

## 2022-07-26 ENCOUNTER — Ambulatory Visit
Admission: RE | Admit: 2022-07-26 | Discharge: 2022-07-26 | Disposition: A | Discharge status: Home / Self Care | Payer: Medicare PPO | Source: Ambulatory Visit | Attending: Family Medicine | Admitting: Family Medicine

## 2022-07-26 DIAGNOSIS — Z1231 Encounter for screening mammogram for malignant neoplasm of breast: Secondary | ICD-10-CM | POA: Diagnosis not present

## 2022-07-31 ENCOUNTER — Other Ambulatory Visit: Payer: Self-pay | Admitting: Family Medicine

## 2022-07-31 DIAGNOSIS — R928 Other abnormal and inconclusive findings on diagnostic imaging of breast: Secondary | ICD-10-CM

## 2022-08-12 DIAGNOSIS — R8279 Other abnormal findings on microbiological examination of urine: Secondary | ICD-10-CM | POA: Diagnosis not present

## 2022-08-12 DIAGNOSIS — N3020 Other chronic cystitis without hematuria: Secondary | ICD-10-CM | POA: Diagnosis not present

## 2022-08-14 ENCOUNTER — Ambulatory Visit
Admission: RE | Admit: 2022-08-14 | Discharge: 2022-08-14 | Disposition: A | Discharge status: Home / Self Care | Payer: Medicare PPO | Source: Ambulatory Visit | Attending: Family Medicine | Admitting: Family Medicine

## 2022-08-14 DIAGNOSIS — R928 Other abnormal and inconclusive findings on diagnostic imaging of breast: Secondary | ICD-10-CM

## 2022-08-14 DIAGNOSIS — N6489 Other specified disorders of breast: Secondary | ICD-10-CM | POA: Diagnosis not present

## 2022-08-28 DIAGNOSIS — L403 Pustulosis palmaris et plantaris: Secondary | ICD-10-CM | POA: Diagnosis not present

## 2022-08-28 DIAGNOSIS — Z79899 Other long term (current) drug therapy: Secondary | ICD-10-CM | POA: Diagnosis not present

## 2022-09-05 IMAGING — MG MM DIGITAL SCREENING BILAT W/ TOMO AND CAD
8 series · 8 of 24 positions shown · non-contrast
Comparison: Previous exam(s).

CLINICAL DATA: Screening.

EXAM:
DIGITAL SCREENING BILATERAL MAMMOGRAM WITH TOMOSYNTHESIS AND CAD
TECHNIQUE: Bilateral screening digital craniocaudal and mediolateral oblique
mammograms were obtained. Bilateral screening digital breast
tomosynthesis was performed. The images were evaluated with
computer-aided detection.

[R CC synth-2D]
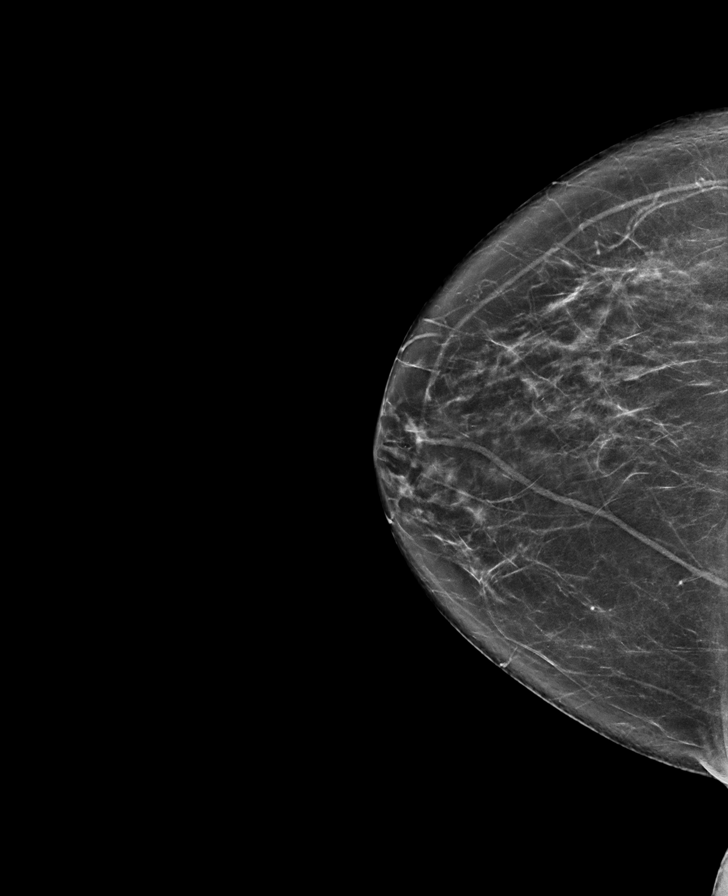

[R MLO synth-2D]
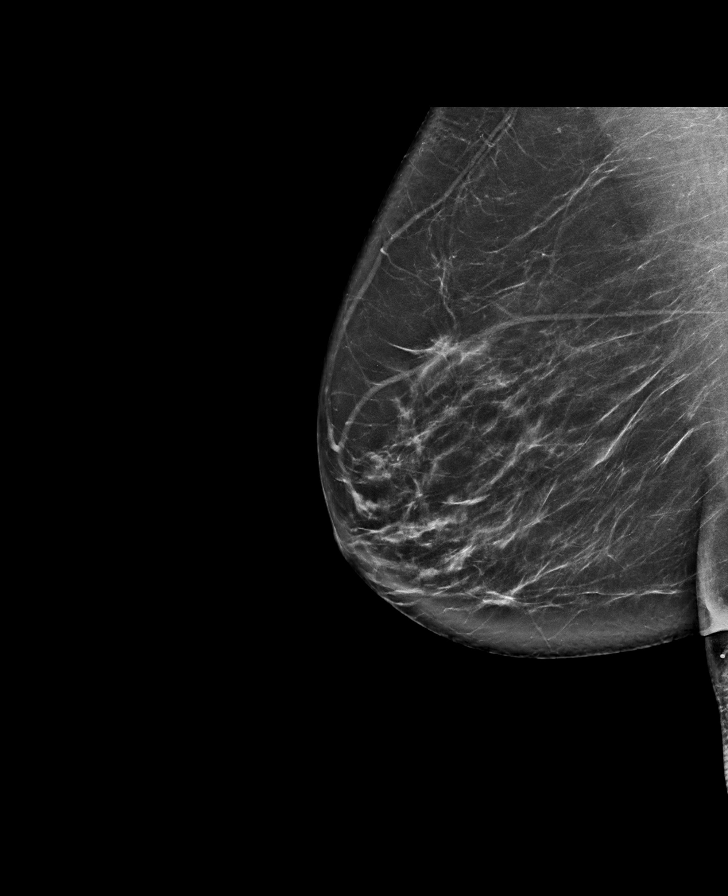

[L CC synth-2D]
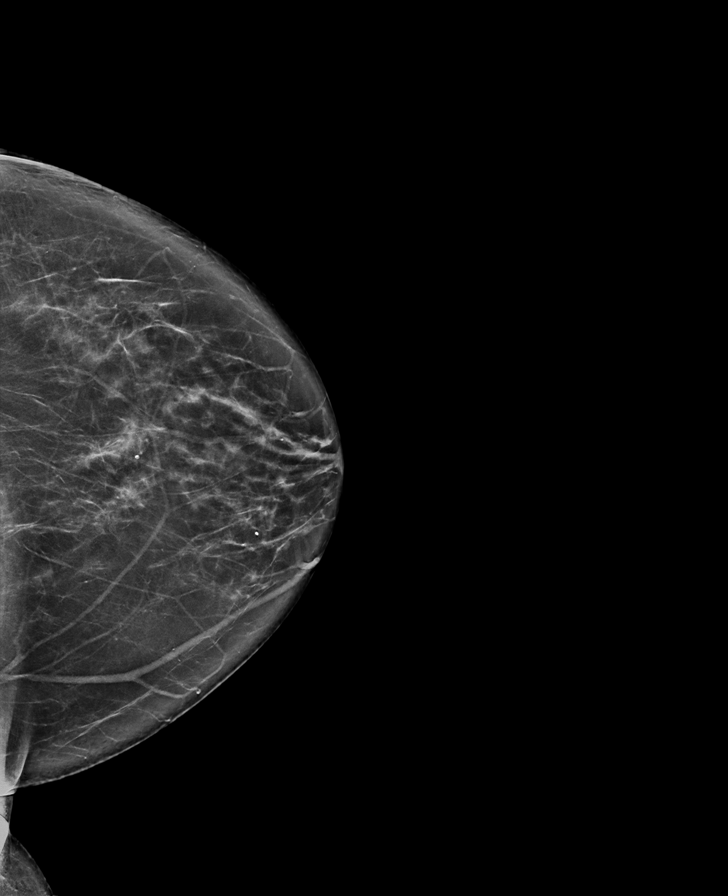

[L MLO synth-2D]
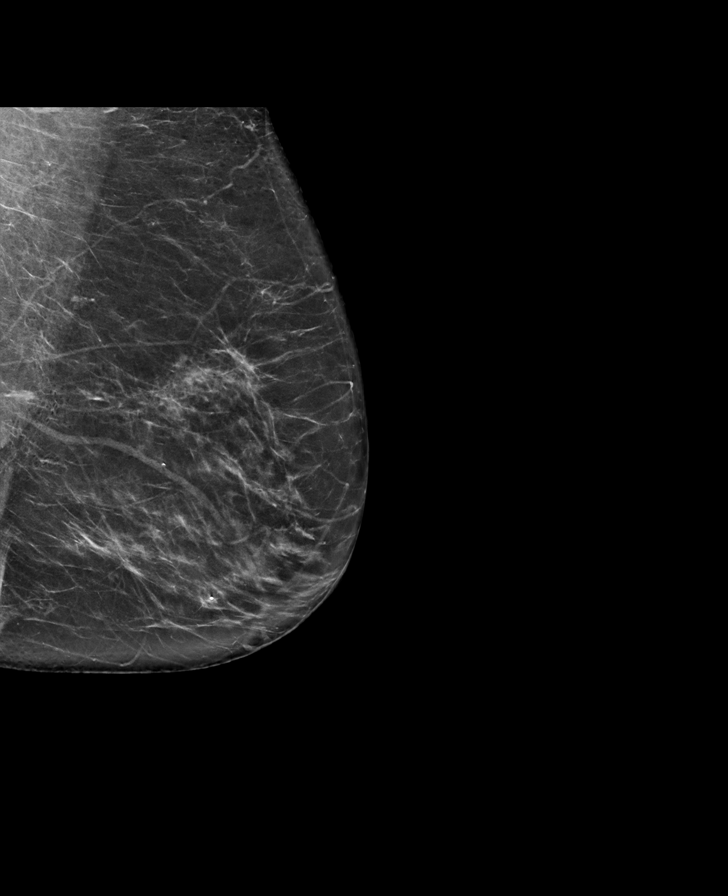

[L CC tomo · tomo slice 37/74.0]
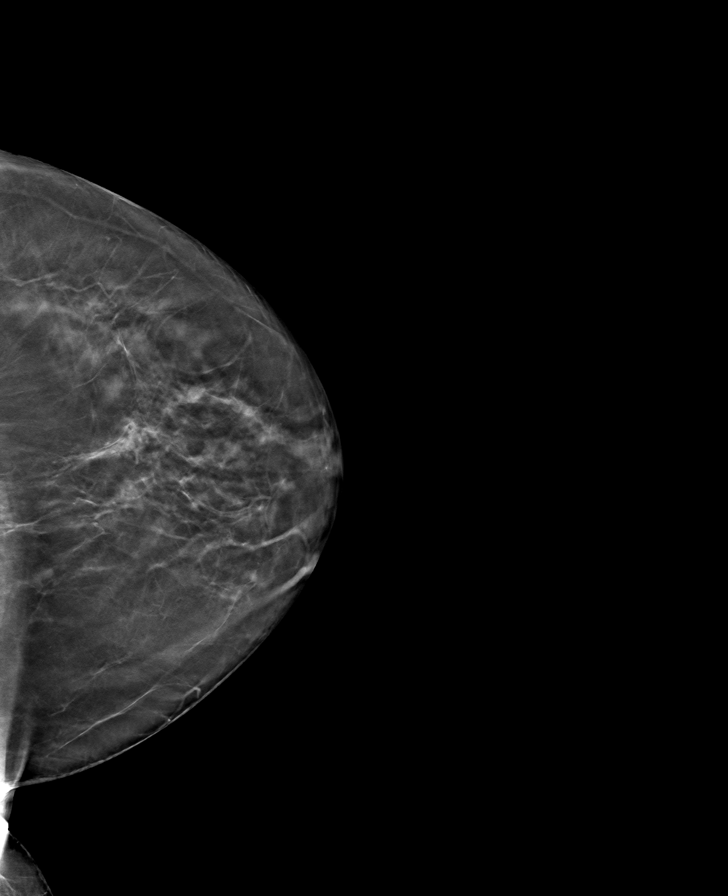

[R CC tomo · tomo slice 36/71.0]
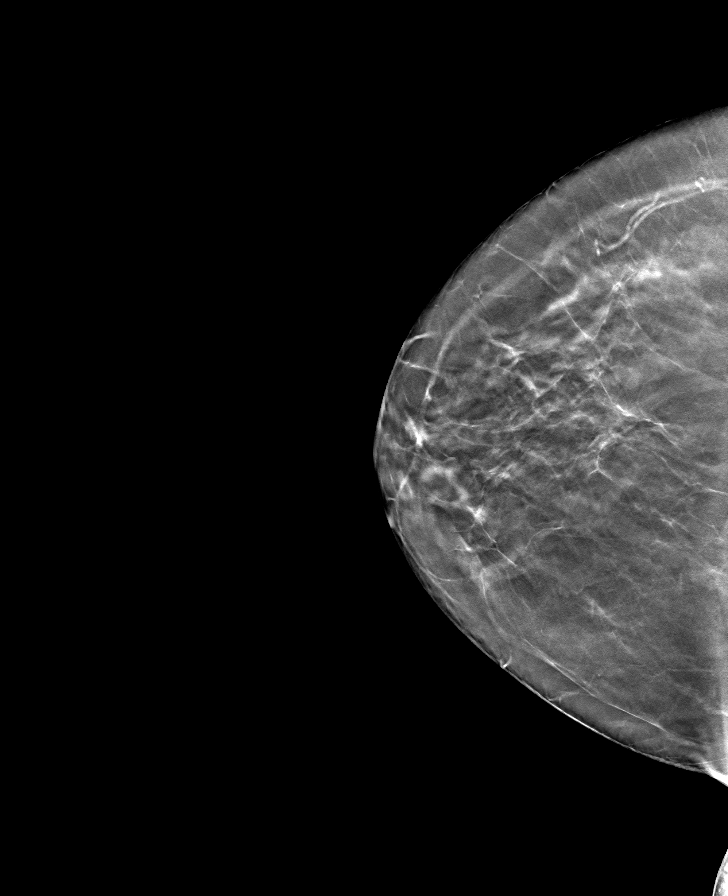

[R MLO tomo · tomo slice 41/80.0]
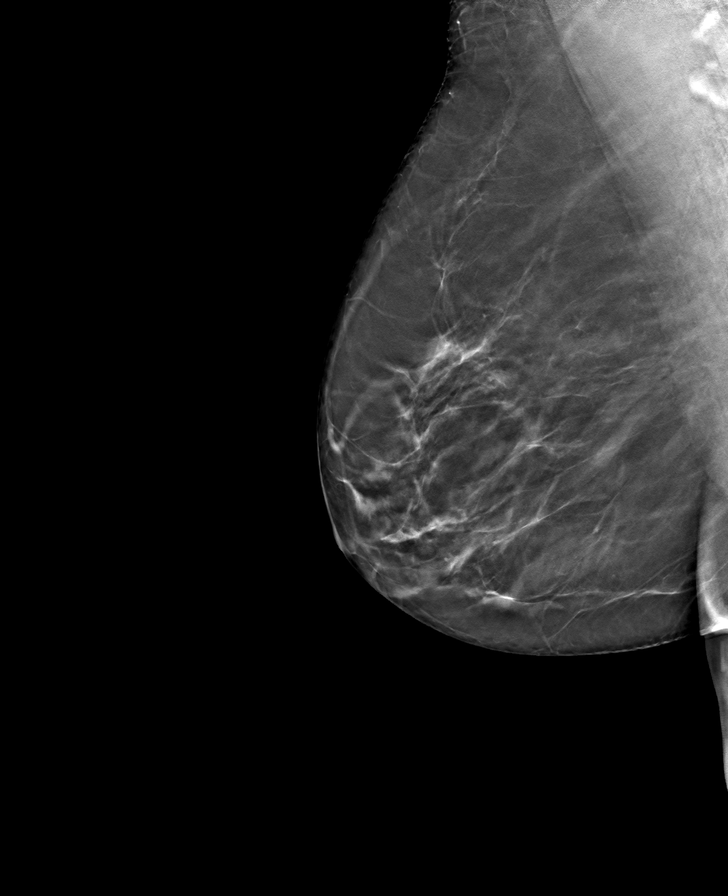

[L MLO tomo · tomo slice 39/76.0]
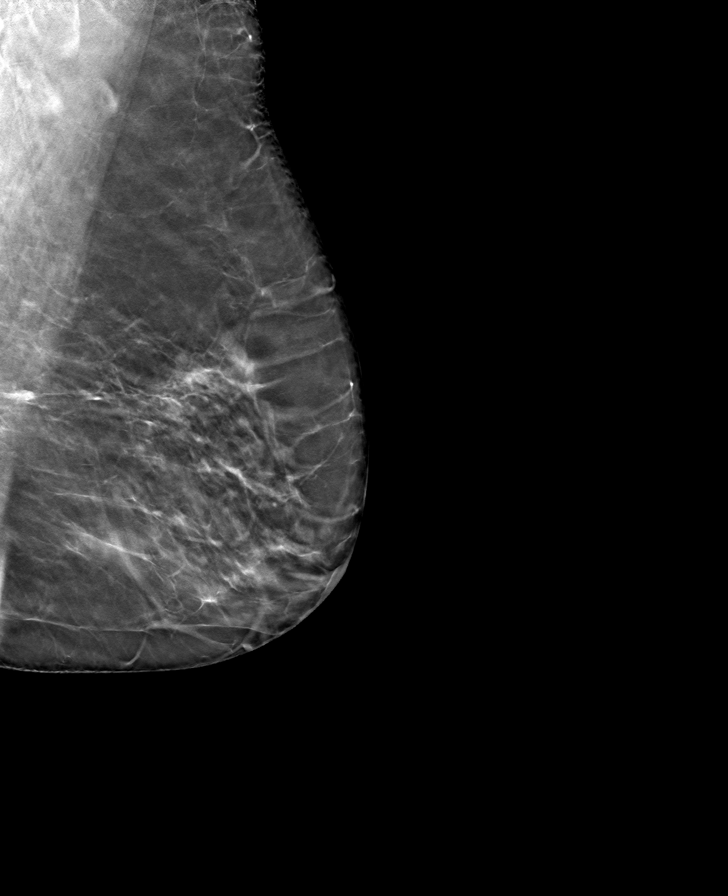

[8 of 24 positions shown; findings below may reference images not displayed]

ACR Breast Density Category b: There are scattered areas of
fibroglandular density.
FINDINGS: There are no findings suspicious for malignancy.
IMPRESSION: No mammographic evidence of malignancy. A result letter of this
screening mammogram will be mailed directly to the patient.

RECOMMENDATION:
Screening mammogram in one year. (Code:51-O-LD2)

BI-RADS CATEGORY  1: Negative.

## 2022-09-17 DIAGNOSIS — Z48815 Encounter for surgical aftercare following surgery on the digestive system: Secondary | ICD-10-CM | POA: Diagnosis not present

## 2022-09-17 DIAGNOSIS — Z9049 Acquired absence of other specified parts of digestive tract: Secondary | ICD-10-CM | POA: Diagnosis not present

## 2022-10-01 DIAGNOSIS — Z0000 Encounter for general adult medical examination without abnormal findings: Secondary | ICD-10-CM | POA: Diagnosis not present

## 2022-10-01 DIAGNOSIS — J309 Allergic rhinitis, unspecified: Secondary | ICD-10-CM | POA: Diagnosis not present

## 2022-10-01 DIAGNOSIS — R7309 Other abnormal glucose: Secondary | ICD-10-CM | POA: Diagnosis not present

## 2022-10-01 DIAGNOSIS — L409 Psoriasis, unspecified: Secondary | ICD-10-CM | POA: Diagnosis not present

## 2022-10-01 DIAGNOSIS — E782 Mixed hyperlipidemia: Secondary | ICD-10-CM | POA: Diagnosis not present

## 2022-10-01 DIAGNOSIS — R7303 Prediabetes: Secondary | ICD-10-CM | POA: Diagnosis not present

## 2022-10-01 DIAGNOSIS — M150 Primary generalized (osteo)arthritis: Secondary | ICD-10-CM | POA: Diagnosis not present

## 2022-10-01 DIAGNOSIS — I10 Essential (primary) hypertension: Secondary | ICD-10-CM | POA: Diagnosis not present

## 2022-10-01 DIAGNOSIS — J0101 Acute recurrent maxillary sinusitis: Secondary | ICD-10-CM | POA: Diagnosis not present

## 2022-10-28 DIAGNOSIS — Z980 Intestinal bypass and anastomosis status: Secondary | ICD-10-CM | POA: Diagnosis not present

## 2022-10-28 DIAGNOSIS — Z86010 Personal history of colonic polyps: Secondary | ICD-10-CM | POA: Diagnosis not present

## 2022-10-28 DIAGNOSIS — Z9889 Other specified postprocedural states: Secondary | ICD-10-CM | POA: Diagnosis not present

## 2022-10-28 DIAGNOSIS — Z9049 Acquired absence of other specified parts of digestive tract: Secondary | ICD-10-CM | POA: Diagnosis not present

## 2022-10-28 DIAGNOSIS — D126 Benign neoplasm of colon, unspecified: Secondary | ICD-10-CM | POA: Diagnosis not present

## 2022-10-28 DIAGNOSIS — K648 Other hemorrhoids: Secondary | ICD-10-CM | POA: Diagnosis not present

## 2022-11-21 DIAGNOSIS — Z6832 Body mass index (BMI) 32.0-32.9, adult: Secondary | ICD-10-CM | POA: Diagnosis not present

## 2022-11-21 DIAGNOSIS — Z124 Encounter for screening for malignant neoplasm of cervix: Secondary | ICD-10-CM | POA: Diagnosis not present

## 2022-11-26 DIAGNOSIS — M25561 Pain in right knee: Secondary | ICD-10-CM | POA: Diagnosis not present

## 2022-12-16 DIAGNOSIS — M25561 Pain in right knee: Secondary | ICD-10-CM | POA: Diagnosis not present

## 2023-01-30 DIAGNOSIS — H5203 Hypermetropia, bilateral: Secondary | ICD-10-CM | POA: Diagnosis not present

## 2023-01-30 DIAGNOSIS — H2513 Age-related nuclear cataract, bilateral: Secondary | ICD-10-CM | POA: Diagnosis not present

## 2023-02-13 DIAGNOSIS — N3010 Interstitial cystitis (chronic) without hematuria: Secondary | ICD-10-CM | POA: Diagnosis not present

## 2023-02-13 DIAGNOSIS — N200 Calculus of kidney: Secondary | ICD-10-CM | POA: Diagnosis not present

## 2023-02-13 DIAGNOSIS — N3020 Other chronic cystitis without hematuria: Secondary | ICD-10-CM | POA: Diagnosis not present

## 2023-03-14 DIAGNOSIS — L570 Actinic keratosis: Secondary | ICD-10-CM | POA: Diagnosis not present

## 2023-03-14 DIAGNOSIS — L403 Pustulosis palmaris et plantaris: Secondary | ICD-10-CM | POA: Diagnosis not present

## 2023-03-14 DIAGNOSIS — Z79899 Other long term (current) drug therapy: Secondary | ICD-10-CM | POA: Diagnosis not present

## 2023-04-04 DIAGNOSIS — R7303 Prediabetes: Secondary | ICD-10-CM | POA: Diagnosis not present

## 2023-04-04 DIAGNOSIS — Z23 Encounter for immunization: Secondary | ICD-10-CM | POA: Diagnosis not present

## 2023-04-04 DIAGNOSIS — E782 Mixed hyperlipidemia: Secondary | ICD-10-CM | POA: Diagnosis not present

## 2023-04-04 DIAGNOSIS — L409 Psoriasis, unspecified: Secondary | ICD-10-CM | POA: Diagnosis not present

## 2023-04-04 DIAGNOSIS — I10 Essential (primary) hypertension: Secondary | ICD-10-CM | POA: Diagnosis not present

## 2023-04-04 DIAGNOSIS — H6691 Otitis media, unspecified, right ear: Secondary | ICD-10-CM | POA: Diagnosis not present

## 2023-04-04 DIAGNOSIS — J309 Allergic rhinitis, unspecified: Secondary | ICD-10-CM | POA: Diagnosis not present

## 2023-04-04 DIAGNOSIS — M150 Primary generalized (osteo)arthritis: Secondary | ICD-10-CM | POA: Diagnosis not present

## 2023-04-21 DIAGNOSIS — E782 Mixed hyperlipidemia: Secondary | ICD-10-CM | POA: Diagnosis not present

## 2023-04-21 DIAGNOSIS — E119 Type 2 diabetes mellitus without complications: Secondary | ICD-10-CM | POA: Diagnosis not present

## 2023-04-21 DIAGNOSIS — Z6830 Body mass index (BMI) 30.0-30.9, adult: Secondary | ICD-10-CM | POA: Diagnosis not present

## 2023-04-21 DIAGNOSIS — E6689 Other obesity not elsewhere classified: Secondary | ICD-10-CM | POA: Diagnosis not present

## 2023-04-21 DIAGNOSIS — I10 Essential (primary) hypertension: Secondary | ICD-10-CM | POA: Diagnosis not present

## 2023-04-23 DIAGNOSIS — M25561 Pain in right knee: Secondary | ICD-10-CM | POA: Diagnosis not present

## 2023-05-02 DIAGNOSIS — N3000 Acute cystitis without hematuria: Secondary | ICD-10-CM | POA: Diagnosis not present

## 2023-06-04 DIAGNOSIS — M25561 Pain in right knee: Secondary | ICD-10-CM | POA: Diagnosis not present

## 2023-06-12 DIAGNOSIS — S83231D Complex tear of medial meniscus, current injury, right knee, subsequent encounter: Secondary | ICD-10-CM | POA: Diagnosis not present

## 2023-06-12 DIAGNOSIS — M1711 Unilateral primary osteoarthritis, right knee: Secondary | ICD-10-CM | POA: Diagnosis not present

## 2023-06-25 ENCOUNTER — Other Ambulatory Visit: Payer: Self-pay | Admitting: Family Medicine

## 2023-06-25 DIAGNOSIS — Z1231 Encounter for screening mammogram for malignant neoplasm of breast: Secondary | ICD-10-CM

## 2023-07-31 DIAGNOSIS — K130 Diseases of lips: Secondary | ICD-10-CM | POA: Diagnosis not present

## 2023-07-31 DIAGNOSIS — H9201 Otalgia, right ear: Secondary | ICD-10-CM | POA: Diagnosis not present

## 2023-07-31 DIAGNOSIS — J329 Chronic sinusitis, unspecified: Secondary | ICD-10-CM | POA: Diagnosis not present

## 2023-08-15 ENCOUNTER — Ambulatory Visit: Payer: Medicare PPO

## 2023-08-19 ENCOUNTER — Ambulatory Visit
Admission: RE | Admit: 2023-08-19 | Discharge: 2023-08-19 | Disposition: A | Discharge status: Home / Self Care | Source: Ambulatory Visit | Attending: Family Medicine | Admitting: Family Medicine

## 2023-08-19 DIAGNOSIS — Z1231 Encounter for screening mammogram for malignant neoplasm of breast: Secondary | ICD-10-CM

## 2023-09-04 DIAGNOSIS — M1711 Unilateral primary osteoarthritis, right knee: Secondary | ICD-10-CM | POA: Diagnosis not present

## 2023-09-23 DIAGNOSIS — L403 Pustulosis palmaris et plantaris: Secondary | ICD-10-CM | POA: Diagnosis not present

## 2023-09-23 DIAGNOSIS — Z79899 Other long term (current) drug therapy: Secondary | ICD-10-CM | POA: Diagnosis not present

## 2023-10-07 DIAGNOSIS — L409 Psoriasis, unspecified: Secondary | ICD-10-CM | POA: Diagnosis not present

## 2023-10-07 DIAGNOSIS — S83203D Other tear of unspecified meniscus, current injury, right knee, subsequent encounter: Secondary | ICD-10-CM | POA: Diagnosis not present

## 2023-10-07 DIAGNOSIS — M150 Primary generalized (osteo)arthritis: Secondary | ICD-10-CM | POA: Diagnosis not present

## 2023-10-07 DIAGNOSIS — J309 Allergic rhinitis, unspecified: Secondary | ICD-10-CM | POA: Diagnosis not present

## 2023-10-07 DIAGNOSIS — R7303 Prediabetes: Secondary | ICD-10-CM | POA: Diagnosis not present

## 2023-10-07 DIAGNOSIS — E782 Mixed hyperlipidemia: Secondary | ICD-10-CM | POA: Diagnosis not present

## 2023-10-07 DIAGNOSIS — I10 Essential (primary) hypertension: Secondary | ICD-10-CM | POA: Diagnosis not present

## 2023-10-07 DIAGNOSIS — Z0000 Encounter for general adult medical examination without abnormal findings: Secondary | ICD-10-CM | POA: Diagnosis not present

## 2023-10-14 ENCOUNTER — Encounter (INDEPENDENT_AMBULATORY_CARE_PROVIDER_SITE_OTHER): Payer: Self-pay | Admitting: Otolaryngology

## 2023-10-14 ENCOUNTER — Ambulatory Visit (INDEPENDENT_AMBULATORY_CARE_PROVIDER_SITE_OTHER): Admitting: Otolaryngology

## 2023-10-14 VITALS — BP 135/84 | HR 74 | Ht 61.0 in | Wt 150.0 lb

## 2023-10-14 DIAGNOSIS — H9201 Otalgia, right ear: Secondary | ICD-10-CM | POA: Diagnosis not present

## 2023-10-14 DIAGNOSIS — H6121 Impacted cerumen, right ear: Secondary | ICD-10-CM

## 2023-10-15 DIAGNOSIS — H9201 Otalgia, right ear: Secondary | ICD-10-CM | POA: Insufficient documentation

## 2023-10-15 DIAGNOSIS — H6121 Impacted cerumen, right ear: Secondary | ICD-10-CM | POA: Insufficient documentation

## 2023-10-15 NOTE — Progress Notes (Signed)
 CC: Right ear pain  HPI:  Tara Downs is a 73 y.o. female who presents today complaining of intermittent right ear pain for the past 6 to 8 months.  She has also noted occasional clogging sensation in the right ear.  She denies any significant otorrhea, vertigo, or change in her hearing.  She has no previous history of otitis media or otitis externa.  She has no previous otologic surgery.  She has never worn hearing aids.  Past Medical History:  Diagnosis Date   Allergy    Arthritis    Interstitial cystitis    Psoriasis     Past Surgical History:  Procedure Laterality Date   CERVICAL FUSION     CERVICAL LAMINECTOMY      Family History  Problem Relation Age of Onset   Breast cancer Maternal Aunt     Social History:  reports that she has been smoking cigarettes. She has a 10 pack-year smoking history. She has never used smokeless tobacco. She reports that she does not drink alcohol and does not use drugs.  Allergies:  Allergies  Allergen Reactions   Codeine Nausea And Vomiting   Tetracycline Other (See Comments)    unknown  Dematologist told pt to never take tetracycline, drug interactions   Tetracyclines & Related Other (See Comments)    Dematologist told pt to never take tetracycline, drug interactions     Prior to Admission medications   Medication Sig Start Date End Date Taking? Authorizing Provider  acitretin (SORIATANE) 10 MG capsule Take 20 mg by mouth daily.    Yes [provider]  amitriptyline (ELAVIL) 10 MG tablet Take 10 mg by mouth at bedtime.   Yes [provider]  aspirin 81 MG tablet Take 81 mg by mouth daily.   Yes [provider]  atorvastatin (LIPITOR) 20 MG tablet Take 20 mg by mouth daily.   Yes [provider]  metoprolol (LOPRESSOR) 100 MG tablet Take 100 mg by mouth daily.    Yes [provider]  pentosan polysulfate (ELMIRON) 100 MG capsule Take 100 mg by mouth 2 (two) times daily before a meal.    Yes  [provider]  traMADol (ULTRAM) 50 MG tablet Take 50 mg by mouth every 6 (six) hours as needed for moderate pain.   Yes [provider]  tretinoin (RETIN-A) 0.05 % cream Apply 1 application topically daily as needed.   Yes [provider]  trimethoprim -polymyxin b  (POLYTRIM ) ophthalmic solution Place 2 drops into the right eye every 6 (six) hours. 12/14/15  Yes Valere Gata, MD    Blood pressure 135/84, pulse 74, height 5\' 1"  (1.549 m), weight 150 lb (68 kg), SpO2 94%. Exam: General: Communicates without difficulty, well nourished, no acute distress. Head: Normocephalic, no evidence injury, no tenderness, facial buttresses intact without stepoff. Face/sinus: No tenderness to palpation and percussion. Facial movement is normal and symmetric. Eyes: PERRL, EOMI. No scleral icterus, conjunctivae clear. Neuro: CN II exam reveals vision grossly intact.  No nystagmus at any point of gaze. Ears: Auricles well formed without lesions.  The left ear canal and tympanic membranes are normal.  Right ear dense cerumen impaction.  Nose: External evaluation reveals normal support and skin without lesions.  Dorsum is intact.  Anterior rhinoscopy reveals normal mucosa over anterior aspect of inferior turbinates and intact septum.  No purulence noted. Oral:  Oral cavity and oropharynx are intact, symmetric, without erythema or edema.  Mucosa is moist without lesions. Neck: Full  range of motion without pain.  There is no significant lymphadenopathy.  No masses palpable.  Thyroid bed within normal limits to palpation.  Parotid glands and submandibular glands equal bilaterally without mass.  Trachea is midline. Neuro:  CN 2-12 grossly intact.   Procedure: Right ear cerumen disimpaction Anesthesia: None Description: Under the operating microscope, the cerumen is carefully removed with a combination of cerumen currette, alligator forceps, and suction catheters.  The patient cannot tolerate the  complete disimpaction procedure.  A moderate amount of cerumen remained within the ear canal.  No mass, erythema, or lesions. The patient tolerated the procedure well.   Assessment: 1.  Right ear pain, likely secondary to the dense cerumen impaction. 2.  Her left ear canal and tympanic membrane are normal. 3.  No acute infection is noted today.  Plan: 1.  The physical exam findings are reviewed with the patient. 2.  Otomicroscopy with partial disimpaction of the right ear canal. 3.  Debrox eardrops daily for 1 week. 4.  The patient will return for reevaluation in 1 week.  France Noyce W Jeanae Whitmill 10/15/2023, 11:18 AM

## 2023-10-22 ENCOUNTER — Encounter (INDEPENDENT_AMBULATORY_CARE_PROVIDER_SITE_OTHER): Payer: Self-pay | Admitting: Otolaryngology

## 2023-10-22 ENCOUNTER — Ambulatory Visit (INDEPENDENT_AMBULATORY_CARE_PROVIDER_SITE_OTHER): Admitting: Otolaryngology

## 2023-10-22 VITALS — BP 141/82 | HR 68 | Ht 61.0 in | Wt 150.0 lb

## 2023-10-22 DIAGNOSIS — H9201 Otalgia, right ear: Secondary | ICD-10-CM

## 2023-10-22 DIAGNOSIS — Z8669 Personal history of other diseases of the nervous system and sense organs: Secondary | ICD-10-CM

## 2023-10-22 DIAGNOSIS — Z09 Encounter for follow-up examination after completed treatment for conditions other than malignant neoplasm: Secondary | ICD-10-CM | POA: Diagnosis not present

## 2023-10-23 NOTE — Progress Notes (Signed)
 Patient ID: Tara Downs, female   DOB: February 20, 1951, 73 y.o.   MRN: [id number]  Follow-up: Right ear pain  HPI: The patient is a 73 year old female who returns today for her follow-up evaluation.  The patient was last seen 1 week ago.  At that time, she was complaining of right ear pain.  She was noted to have dense right ear cerumen impaction.  She was treated with right cerumen disimpaction and Debrox eardrops.  The patient returns today reporting intermittent right ear pain.  She denies any otorrhea, vertigo, or change in her hearing.  Exam: General: Communicates without difficulty, well nourished, no acute distress. Head: Normocephalic, no evidence injury, no tenderness, facial buttresses intact without stepoff. Face/sinus: No tenderness to palpation and percussion. Facial movement is normal and symmetric. Eyes: PERRL, EOMI. No scleral icterus, conjunctivae clear. Neuro: CN II exam reveals vision grossly intact.  No nystagmus at any point of gaze. Ears: Auricles well formed without lesions.  Ear canals are intact without mass or lesion.  No erythema or edema is appreciated.  The TMs are intact without fluid. Nose: External evaluation reveals normal support and skin without lesions.  Dorsum is intact.  Anterior rhinoscopy reveals normal mucosa over anterior aspect of inferior turbinates and intact septum.  No purulence noted. Oral:  Oral cavity and oropharynx are intact, symmetric, without erythema or edema.  Mucosa is moist without lesions. Neck: Full range of motion without pain.  There is no significant lymphadenopathy.  No masses palpable.  Thyroid bed within normal limits to palpation.  Parotid glands and submandibular glands equal bilaterally without mass.  Trachea is midline. Neuro:  CN 2-12 grossly intact.   Assessment: 1.  The patient's ear canals, tympanic membranes, and middle ear spaces are normal. 2.  No cerumen impaction is noted today.  Plan: 1.  The physical exam findings are  reviewed with the patient. 2.  The patient is reassured that no infection is noted today. 3.  The patient may use Tylenol/NSAIDs as needed to treat her referred otalgia. 4.  The patient is encouraged to call with any questions or concerns.

## 2023-10-29 DIAGNOSIS — M545 Low back pain, unspecified: Secondary | ICD-10-CM | POA: Diagnosis not present

## 2023-10-29 DIAGNOSIS — N390 Urinary tract infection, site not specified: Secondary | ICD-10-CM | POA: Diagnosis not present

## 2023-10-29 DIAGNOSIS — R300 Dysuria: Secondary | ICD-10-CM | POA: Diagnosis not present

## 2023-11-03 DIAGNOSIS — M25561 Pain in right knee: Secondary | ICD-10-CM | POA: Diagnosis not present

## 2023-11-03 DIAGNOSIS — M1711 Unilateral primary osteoarthritis, right knee: Secondary | ICD-10-CM | POA: Diagnosis not present

## 2023-12-02 DIAGNOSIS — G8918 Other acute postprocedural pain: Secondary | ICD-10-CM | POA: Diagnosis not present

## 2023-12-02 DIAGNOSIS — M1711 Unilateral primary osteoarthritis, right knee: Secondary | ICD-10-CM | POA: Diagnosis not present

## 2023-12-04 DIAGNOSIS — M25561 Pain in right knee: Secondary | ICD-10-CM | POA: Diagnosis not present

## 2023-12-04 DIAGNOSIS — M25661 Stiffness of right knee, not elsewhere classified: Secondary | ICD-10-CM | POA: Diagnosis not present

## 2023-12-04 DIAGNOSIS — R29898 Other symptoms and signs involving the musculoskeletal system: Secondary | ICD-10-CM | POA: Diagnosis not present

## 2023-12-04 DIAGNOSIS — M25461 Effusion, right knee: Secondary | ICD-10-CM | POA: Diagnosis not present

## 2023-12-08 DIAGNOSIS — M25661 Stiffness of right knee, not elsewhere classified: Secondary | ICD-10-CM | POA: Diagnosis not present

## 2023-12-08 DIAGNOSIS — R29898 Other symptoms and signs involving the musculoskeletal system: Secondary | ICD-10-CM | POA: Diagnosis not present

## 2023-12-08 DIAGNOSIS — M25461 Effusion, right knee: Secondary | ICD-10-CM | POA: Diagnosis not present

## 2023-12-08 DIAGNOSIS — M25561 Pain in right knee: Secondary | ICD-10-CM | POA: Diagnosis not present

## 2023-12-15 DIAGNOSIS — R29898 Other symptoms and signs involving the musculoskeletal system: Secondary | ICD-10-CM | POA: Diagnosis not present

## 2023-12-15 DIAGNOSIS — M25561 Pain in right knee: Secondary | ICD-10-CM | POA: Diagnosis not present

## 2023-12-15 DIAGNOSIS — M25461 Effusion, right knee: Secondary | ICD-10-CM | POA: Diagnosis not present

## 2023-12-15 DIAGNOSIS — M25661 Stiffness of right knee, not elsewhere classified: Secondary | ICD-10-CM | POA: Diagnosis not present

## 2023-12-22 DIAGNOSIS — M25461 Effusion, right knee: Secondary | ICD-10-CM | POA: Diagnosis not present

## 2023-12-22 DIAGNOSIS — R29898 Other symptoms and signs involving the musculoskeletal system: Secondary | ICD-10-CM | POA: Diagnosis not present

## 2023-12-22 DIAGNOSIS — M25561 Pain in right knee: Secondary | ICD-10-CM | POA: Diagnosis not present

## 2023-12-22 DIAGNOSIS — M25661 Stiffness of right knee, not elsewhere classified: Secondary | ICD-10-CM | POA: Diagnosis not present

## 2023-12-26 DIAGNOSIS — M25661 Stiffness of right knee, not elsewhere classified: Secondary | ICD-10-CM | POA: Diagnosis not present

## 2023-12-26 DIAGNOSIS — R29898 Other symptoms and signs involving the musculoskeletal system: Secondary | ICD-10-CM | POA: Diagnosis not present

## 2023-12-26 DIAGNOSIS — M25461 Effusion, right knee: Secondary | ICD-10-CM | POA: Diagnosis not present

## 2023-12-26 DIAGNOSIS — M25561 Pain in right knee: Secondary | ICD-10-CM | POA: Diagnosis not present

## 2023-12-30 DIAGNOSIS — R29898 Other symptoms and signs involving the musculoskeletal system: Secondary | ICD-10-CM | POA: Diagnosis not present

## 2023-12-30 DIAGNOSIS — M25561 Pain in right knee: Secondary | ICD-10-CM | POA: Diagnosis not present

## 2023-12-30 DIAGNOSIS — M25661 Stiffness of right knee, not elsewhere classified: Secondary | ICD-10-CM | POA: Diagnosis not present

## 2023-12-30 DIAGNOSIS — M25461 Effusion, right knee: Secondary | ICD-10-CM | POA: Diagnosis not present

## 2024-01-02 DIAGNOSIS — M25461 Effusion, right knee: Secondary | ICD-10-CM | POA: Diagnosis not present

## 2024-01-02 DIAGNOSIS — R29898 Other symptoms and signs involving the musculoskeletal system: Secondary | ICD-10-CM | POA: Diagnosis not present

## 2024-01-02 DIAGNOSIS — M25561 Pain in right knee: Secondary | ICD-10-CM | POA: Diagnosis not present

## 2024-01-02 DIAGNOSIS — M25661 Stiffness of right knee, not elsewhere classified: Secondary | ICD-10-CM | POA: Diagnosis not present

## 2024-01-05 DIAGNOSIS — M25461 Effusion, right knee: Secondary | ICD-10-CM | POA: Diagnosis not present

## 2024-01-05 DIAGNOSIS — R29898 Other symptoms and signs involving the musculoskeletal system: Secondary | ICD-10-CM | POA: Diagnosis not present

## 2024-01-05 DIAGNOSIS — M25661 Stiffness of right knee, not elsewhere classified: Secondary | ICD-10-CM | POA: Diagnosis not present

## 2024-01-05 DIAGNOSIS — M25561 Pain in right knee: Secondary | ICD-10-CM | POA: Diagnosis not present

## 2024-01-08 DIAGNOSIS — R29898 Other symptoms and signs involving the musculoskeletal system: Secondary | ICD-10-CM | POA: Diagnosis not present

## 2024-01-08 DIAGNOSIS — M25661 Stiffness of right knee, not elsewhere classified: Secondary | ICD-10-CM | POA: Diagnosis not present

## 2024-01-08 DIAGNOSIS — M25461 Effusion, right knee: Secondary | ICD-10-CM | POA: Diagnosis not present

## 2024-01-08 DIAGNOSIS — M25561 Pain in right knee: Secondary | ICD-10-CM | POA: Diagnosis not present

## 2024-01-12 DIAGNOSIS — M25561 Pain in right knee: Secondary | ICD-10-CM | POA: Diagnosis not present

## 2024-01-12 DIAGNOSIS — R29898 Other symptoms and signs involving the musculoskeletal system: Secondary | ICD-10-CM | POA: Diagnosis not present

## 2024-01-12 DIAGNOSIS — M25461 Effusion, right knee: Secondary | ICD-10-CM | POA: Diagnosis not present

## 2024-01-12 DIAGNOSIS — M25661 Stiffness of right knee, not elsewhere classified: Secondary | ICD-10-CM | POA: Diagnosis not present

## 2024-01-19 DIAGNOSIS — M25461 Effusion, right knee: Secondary | ICD-10-CM | POA: Diagnosis not present

## 2024-01-19 DIAGNOSIS — R29898 Other symptoms and signs involving the musculoskeletal system: Secondary | ICD-10-CM | POA: Diagnosis not present

## 2024-01-19 DIAGNOSIS — M25561 Pain in right knee: Secondary | ICD-10-CM | POA: Diagnosis not present

## 2024-01-19 DIAGNOSIS — M25661 Stiffness of right knee, not elsewhere classified: Secondary | ICD-10-CM | POA: Diagnosis not present

## 2024-01-23 DIAGNOSIS — M25461 Effusion, right knee: Secondary | ICD-10-CM | POA: Diagnosis not present

## 2024-01-23 DIAGNOSIS — M25661 Stiffness of right knee, not elsewhere classified: Secondary | ICD-10-CM | POA: Diagnosis not present

## 2024-01-23 DIAGNOSIS — R29898 Other symptoms and signs involving the musculoskeletal system: Secondary | ICD-10-CM | POA: Diagnosis not present

## 2024-01-23 DIAGNOSIS — M25561 Pain in right knee: Secondary | ICD-10-CM | POA: Diagnosis not present

## 2024-01-27 DIAGNOSIS — M25561 Pain in right knee: Secondary | ICD-10-CM | POA: Diagnosis not present

## 2024-01-27 DIAGNOSIS — M25661 Stiffness of right knee, not elsewhere classified: Secondary | ICD-10-CM | POA: Diagnosis not present

## 2024-01-27 DIAGNOSIS — R29898 Other symptoms and signs involving the musculoskeletal system: Secondary | ICD-10-CM | POA: Diagnosis not present

## 2024-01-27 DIAGNOSIS — M25461 Effusion, right knee: Secondary | ICD-10-CM | POA: Diagnosis not present

## 2024-02-03 DIAGNOSIS — M25461 Effusion, right knee: Secondary | ICD-10-CM | POA: Diagnosis not present

## 2024-02-03 DIAGNOSIS — R29898 Other symptoms and signs involving the musculoskeletal system: Secondary | ICD-10-CM | POA: Diagnosis not present

## 2024-02-03 DIAGNOSIS — M25561 Pain in right knee: Secondary | ICD-10-CM | POA: Diagnosis not present

## 2024-02-03 DIAGNOSIS — M25661 Stiffness of right knee, not elsewhere classified: Secondary | ICD-10-CM | POA: Diagnosis not present

## 2024-02-10 DIAGNOSIS — Z5189 Encounter for other specified aftercare: Secondary | ICD-10-CM | POA: Diagnosis not present

## 2024-02-12 DIAGNOSIS — M25661 Stiffness of right knee, not elsewhere classified: Secondary | ICD-10-CM | POA: Diagnosis not present

## 2024-02-12 DIAGNOSIS — R29898 Other symptoms and signs involving the musculoskeletal system: Secondary | ICD-10-CM | POA: Diagnosis not present

## 2024-02-12 DIAGNOSIS — M25561 Pain in right knee: Secondary | ICD-10-CM | POA: Diagnosis not present

## 2024-02-12 DIAGNOSIS — M25461 Effusion, right knee: Secondary | ICD-10-CM | POA: Diagnosis not present

## 2024-03-02 DIAGNOSIS — R300 Dysuria: Secondary | ICD-10-CM | POA: Diagnosis not present

## 2024-03-11 DIAGNOSIS — Z23 Encounter for immunization: Secondary | ICD-10-CM | POA: Diagnosis not present

## 2024-03-23 DIAGNOSIS — L403 Pustulosis palmaris et plantaris: Secondary | ICD-10-CM | POA: Diagnosis not present

## 2024-03-23 DIAGNOSIS — Z79899 Other long term (current) drug therapy: Secondary | ICD-10-CM | POA: Diagnosis not present

## 2024-03-29 DIAGNOSIS — H2513 Age-related nuclear cataract, bilateral: Secondary | ICD-10-CM | POA: Diagnosis not present

## 2024-03-29 DIAGNOSIS — H524 Presbyopia: Secondary | ICD-10-CM | POA: Diagnosis not present

## 2024-03-29 DIAGNOSIS — H353131 Nonexudative age-related macular degeneration, bilateral, early dry stage: Secondary | ICD-10-CM | POA: Diagnosis not present

## 2024-04-13 DIAGNOSIS — I10 Essential (primary) hypertension: Secondary | ICD-10-CM | POA: Diagnosis not present

## 2024-04-13 DIAGNOSIS — L409 Psoriasis, unspecified: Secondary | ICD-10-CM | POA: Diagnosis not present

## 2024-04-13 DIAGNOSIS — R7303 Prediabetes: Secondary | ICD-10-CM | POA: Diagnosis not present

## 2024-04-13 DIAGNOSIS — M150 Primary generalized (osteo)arthritis: Secondary | ICD-10-CM | POA: Diagnosis not present

## 2024-04-13 DIAGNOSIS — J0100 Acute maxillary sinusitis, unspecified: Secondary | ICD-10-CM | POA: Diagnosis not present

## 2024-04-13 DIAGNOSIS — F5101 Primary insomnia: Secondary | ICD-10-CM | POA: Diagnosis not present

## 2024-04-13 DIAGNOSIS — E782 Mixed hyperlipidemia: Secondary | ICD-10-CM | POA: Diagnosis not present

## 2024-04-13 DIAGNOSIS — R197 Diarrhea, unspecified: Secondary | ICD-10-CM | POA: Diagnosis not present

## 2024-04-13 DIAGNOSIS — J309 Allergic rhinitis, unspecified: Secondary | ICD-10-CM | POA: Diagnosis not present

## 2024-04-29 ENCOUNTER — Ambulatory Visit (INDEPENDENT_AMBULATORY_CARE_PROVIDER_SITE_OTHER): Admitting: Otolaryngology

## 2024-05-04 DIAGNOSIS — N201 Calculus of ureter: Secondary | ICD-10-CM | POA: Diagnosis not present

## 2024-05-04 DIAGNOSIS — N3020 Other chronic cystitis without hematuria: Secondary | ICD-10-CM | POA: Diagnosis not present

## 2024-05-04 DIAGNOSIS — R351 Nocturia: Secondary | ICD-10-CM | POA: Diagnosis not present

## 2024-05-04 DIAGNOSIS — N3010 Interstitial cystitis (chronic) without hematuria: Secondary | ICD-10-CM | POA: Diagnosis not present

## 2024-06-16 ENCOUNTER — Other Ambulatory Visit: Payer: Self-pay | Admitting: Family Medicine

## 2024-06-16 DIAGNOSIS — Z1231 Encounter for screening mammogram for malignant neoplasm of breast: Secondary | ICD-10-CM

## 2024-07-14 ENCOUNTER — Ambulatory Visit (INDEPENDENT_AMBULATORY_CARE_PROVIDER_SITE_OTHER): Admitting: Otolaryngology

## 2024-08-19 ENCOUNTER — Ambulatory Visit
